# Patient Record
Sex: Female | Born: 1975 | Race: White | Hispanic: No | Marital: Married | Smoking: Never smoker
Health system: Southern US, Community
[De-identification: ages and names within clinical notes are randomized; demographics above are authoritative.]

## PROBLEM LIST (undated history)

## (undated) DIAGNOSIS — Z8759 Personal history of other complications of pregnancy, childbirth and the puerperium: Secondary | ICD-10-CM

## (undated) DIAGNOSIS — J329 Chronic sinusitis, unspecified: Secondary | ICD-10-CM

## (undated) DIAGNOSIS — Z973 Presence of spectacles and contact lenses: Secondary | ICD-10-CM

## (undated) DIAGNOSIS — J302 Other seasonal allergic rhinitis: Secondary | ICD-10-CM

## (undated) DIAGNOSIS — R102 Pelvic and perineal pain unspecified side: Secondary | ICD-10-CM

## (undated) DIAGNOSIS — L409 Psoriasis, unspecified: Secondary | ICD-10-CM

## (undated) HISTORY — DX: Psoriasis, unspecified: L40.9

## (undated) HISTORY — PX: DILATION AND EVACUATION: SHX1459

---

## 1999-10-01 HISTORY — PX: IM NAILING FEMORAL SHAFT FRACTURE: SUR731

## 2004-08-22 ENCOUNTER — Ambulatory Visit: Payer: Self-pay | Admitting: Family Medicine

## 2004-11-23 ENCOUNTER — Ambulatory Visit: Payer: Self-pay | Admitting: Family Medicine

## 2005-05-15 ENCOUNTER — Ambulatory Visit: Payer: Self-pay | Admitting: Family Medicine

## 2005-06-27 ENCOUNTER — Ambulatory Visit: Payer: Self-pay | Admitting: Family Medicine

## 2005-07-01 ENCOUNTER — Encounter: Admission: RE | Admit: 2005-07-01 | Discharge: 2005-07-01 | Payer: Self-pay | Admitting: Family Medicine

## 2005-07-04 ENCOUNTER — Ambulatory Visit: Payer: Self-pay | Admitting: Family Medicine

## 2005-07-17 ENCOUNTER — Ambulatory Visit: Payer: Self-pay | Admitting: Internal Medicine

## 2005-10-01 ENCOUNTER — Other Ambulatory Visit: Admission: RE | Admit: 2005-10-01 | Discharge: 2005-10-01 | Payer: Self-pay | Admitting: Obstetrics and Gynecology

## 2005-10-24 ENCOUNTER — Ambulatory Visit (HOSPITAL_COMMUNITY): Admission: RE | Admit: 2005-10-24 | Discharge: 2005-10-24 | Payer: Self-pay | Admitting: Obstetrics and Gynecology

## 2005-11-26 ENCOUNTER — Ambulatory Visit (HOSPITAL_COMMUNITY): Admission: RE | Admit: 2005-11-26 | Discharge: 2005-11-26 | Payer: Self-pay | Admitting: Obstetrics and Gynecology

## 2005-12-16 ENCOUNTER — Ambulatory Visit (HOSPITAL_COMMUNITY): Admission: RE | Admit: 2005-12-16 | Discharge: 2005-12-16 | Payer: Self-pay | Admitting: Obstetrics and Gynecology

## 2005-12-17 ENCOUNTER — Encounter (INDEPENDENT_AMBULATORY_CARE_PROVIDER_SITE_OTHER): Payer: Self-pay | Admitting: *Deleted

## 2006-06-04 ENCOUNTER — Inpatient Hospital Stay (HOSPITAL_COMMUNITY): Admission: AD | Admit: 2006-06-04 | Discharge: 2006-06-04 | Payer: Self-pay | Admitting: Obstetrics and Gynecology

## 2006-08-15 ENCOUNTER — Ambulatory Visit: Payer: Self-pay | Admitting: Family Medicine

## 2006-09-30 DIAGNOSIS — Z8759 Personal history of other complications of pregnancy, childbirth and the puerperium: Secondary | ICD-10-CM

## 2006-09-30 HISTORY — DX: Personal history of other complications of pregnancy, childbirth and the puerperium: Z87.59

## 2007-03-11 ENCOUNTER — Inpatient Hospital Stay (HOSPITAL_COMMUNITY): Admission: AD | Admit: 2007-03-11 | Discharge: 2007-03-11 | Payer: Self-pay | Admitting: Obstetrics and Gynecology

## 2007-04-21 ENCOUNTER — Encounter: Admission: RE | Admit: 2007-04-21 | Discharge: 2007-04-21 | Payer: Self-pay | Admitting: Obstetrics and Gynecology

## 2007-06-02 ENCOUNTER — Inpatient Hospital Stay (HOSPITAL_COMMUNITY): Admission: AD | Admit: 2007-06-02 | Discharge: 2007-06-05 | Payer: Self-pay | Admitting: Obstetrics and Gynecology

## 2007-06-02 ENCOUNTER — Encounter (INDEPENDENT_AMBULATORY_CARE_PROVIDER_SITE_OTHER): Payer: Self-pay | Admitting: Obstetrics and Gynecology

## 2007-11-11 ENCOUNTER — Ambulatory Visit: Payer: Self-pay | Admitting: Family Medicine

## 2007-11-11 DIAGNOSIS — L408 Other psoriasis: Secondary | ICD-10-CM

## 2007-11-11 DIAGNOSIS — R519 Headache, unspecified: Secondary | ICD-10-CM | POA: Insufficient documentation

## 2007-11-11 DIAGNOSIS — K219 Gastro-esophageal reflux disease without esophagitis: Secondary | ICD-10-CM

## 2007-11-11 DIAGNOSIS — R51 Headache: Secondary | ICD-10-CM

## 2007-11-13 LAB — CONVERTED CEMR LAB
ALT: 18 units/L (ref 0–35)
AST: 17 units/L (ref 0–37)
Albumin: 4.1 g/dL (ref 3.5–5.2)
Alkaline Phosphatase: 138 units/L — ABNORMAL HIGH (ref 39–117)
BUN: 12 mg/dL (ref 6–23)
Basophils Absolute: 0.1 10*3/uL (ref 0.0–0.1)
Basophils Relative: 1 % (ref 0.0–1.0)
Bilirubin, Direct: 0.2 mg/dL (ref 0.0–0.3)
CO2: 28 meq/L (ref 19–32)
Calcium: 9.7 mg/dL (ref 8.4–10.5)
Chloride: 105 meq/L (ref 96–112)
Cholesterol: 158 mg/dL (ref 0–200)
Creatinine, Ser: 0.8 mg/dL (ref 0.4–1.2)
Eosinophils Absolute: 0.2 10*3/uL (ref 0.0–0.6)
Eosinophils Relative: 3 % (ref 0.0–5.0)
GFR calc Af Amer: 108 mL/min
GFR calc non Af Amer: 90 mL/min
Glucose, Bld: 88 mg/dL (ref 70–99)
HCT: 45.3 % (ref 36.0–46.0)
HDL: 40.5 mg/dL (ref >39.0)
Hemoglobin: 15.3 g/dL — ABNORMAL HIGH (ref 12.0–15.0)
LDL Cholesterol: 102 mg/dL — ABNORMAL HIGH (ref 0–99)
Lymphocytes Relative: 27.2 % (ref 12.0–46.0)
MCHC: 33.7 g/dL (ref 30.0–36.0)
MCV: 88.3 fL (ref 78.0–100.0)
Monocytes Absolute: 0.7 10*3/uL (ref 0.2–0.7)
Monocytes Relative: 9.5 % (ref 3.0–11.0)
Neutro Abs: 4.5 10*3/uL (ref 1.4–7.7)
Neutrophils Relative %: 59.3 % (ref 43.0–77.0)
Platelets: 306 10*3/uL (ref 150–400)
Potassium: 4.6 meq/L (ref 3.5–5.1)
RBC: 5.12 M/uL — ABNORMAL HIGH (ref 3.87–5.11)
RDW: 12.1 % (ref 11.5–14.6)
Sodium: 141 meq/L (ref 135–145)
TSH: 2.19 microintl units/mL (ref 0.35–5.50)
Total Bilirubin: 0.7 mg/dL (ref 0.3–1.2)
Total CHOL/HDL Ratio: 3.9
Total Protein: 7.2 g/dL (ref 6.0–8.3)
Triglycerides: 79 mg/dL (ref 0–149)
VLDL: 16 mg/dL (ref 0–40)
WBC: 7.5 10*3/uL (ref 4.5–10.5)

## 2008-11-14 ENCOUNTER — Ambulatory Visit: Payer: Self-pay | Admitting: Family Medicine

## 2008-11-14 DIAGNOSIS — J209 Acute bronchitis, unspecified: Secondary | ICD-10-CM

## 2008-11-14 DIAGNOSIS — B009 Herpesviral infection, unspecified: Secondary | ICD-10-CM | POA: Insufficient documentation

## 2008-11-15 ENCOUNTER — Telehealth: Payer: Self-pay | Admitting: Family Medicine

## 2008-11-22 ENCOUNTER — Telehealth: Payer: Self-pay | Admitting: Family Medicine

## 2009-10-09 ENCOUNTER — Ambulatory Visit (HOSPITAL_COMMUNITY): Admission: RE | Admit: 2009-10-09 | Discharge: 2009-10-09 | Payer: Self-pay | Admitting: Obstetrics and Gynecology

## 2010-01-16 ENCOUNTER — Ambulatory Visit: Payer: Self-pay | Admitting: Family Medicine

## 2010-01-16 LAB — CONVERTED CEMR LAB
Bilirubin Urine: NEGATIVE
Blood in Urine, dipstick: NEGATIVE
Glucose, Urine, Semiquant: NEGATIVE
Ketones, urine, test strip: NEGATIVE
Nitrite: NEGATIVE
Protein, U semiquant: NEGATIVE
Specific Gravity, Urine: 1.015
Urobilinogen, UA: 0.2
WBC Urine, dipstick: NEGATIVE
pH: 7

## 2010-01-22 ENCOUNTER — Ambulatory Visit: Payer: Self-pay | Admitting: Family Medicine

## 2010-01-23 LAB — CONVERTED CEMR LAB
ALT: 19 units/L (ref 0–35)
AST: 21 units/L (ref 0–37)
Albumin: 3.9 g/dL (ref 3.5–5.2)
Alkaline Phosphatase: 67 units/L (ref 39–117)
BUN: 9 mg/dL (ref 6–23)
Basophils Absolute: 0 10*3/uL (ref 0.0–0.1)
Basophils Relative: 0.5 % (ref 0.0–3.0)
Bilirubin, Direct: 0.1 mg/dL (ref 0.0–0.3)
CO2: 30 meq/L (ref 19–32)
Calcium: 9.3 mg/dL (ref 8.4–10.5)
Chloride: 107 meq/L (ref 96–112)
Cholesterol: 151 mg/dL (ref 0–200)
Creatinine, Ser: 0.8 mg/dL (ref 0.4–1.2)
Eosinophils Absolute: 0.2 10*3/uL (ref 0.0–0.7)
Eosinophils Relative: 2.2 % (ref 0.0–5.0)
GFR calc non Af Amer: 87.73 mL/min (ref >60)
Glucose, Bld: 94 mg/dL (ref 70–99)
HCT: 40.4 % (ref 36.0–46.0)
HDL: 46.6 mg/dL (ref >39.00)
Hemoglobin: 13.9 g/dL (ref 12.0–15.0)
LDL Cholesterol: 87 mg/dL (ref 0–99)
Lymphocytes Relative: 32.2 % (ref 12.0–46.0)
Lymphs Abs: 2.2 10*3/uL (ref 0.7–4.0)
MCHC: 34.4 g/dL (ref 30.0–36.0)
MCV: 91.6 fL (ref 78.0–100.0)
Monocytes Absolute: 0.6 10*3/uL (ref 0.1–1.0)
Monocytes Relative: 8.6 % (ref 3.0–12.0)
Neutro Abs: 3.9 10*3/uL (ref 1.4–7.7)
Neutrophils Relative %: 56.5 % (ref 43.0–77.0)
Platelets: 264 10*3/uL (ref 150.0–400.0)
Potassium: 4.3 meq/L (ref 3.5–5.1)
RBC: 4.41 M/uL (ref 3.87–5.11)
RDW: 12.9 % (ref 11.5–14.6)
Sodium: 148 meq/L — ABNORMAL HIGH (ref 135–145)
TSH: 1.77 microintl units/mL (ref 0.35–5.50)
Total Bilirubin: 0.4 mg/dL (ref 0.3–1.2)
Total CHOL/HDL Ratio: 3
Total Protein: 7.4 g/dL (ref 6.0–8.3)
Triglycerides: 85 mg/dL (ref 0.0–149.0)
VLDL: 17 mg/dL (ref 0.0–40.0)
WBC: 6.9 10*3/uL (ref 4.5–10.5)

## 2010-10-30 NOTE — Assessment & Plan Note (Signed)
Summary: cpx no pap//ccm   Vital Signs:  Patient profile:   35 year old female Weight:      222 pounds BMI:     36.51 Pulse rate:   80 / minute Pulse rhythm:   regular BP sitting:   116 / 80  (left arm) Cuff size:   large  Vitals Entered By: Raechel Ache, RN (January 22, 2010 9:01 AM) CC: CPX, labs done. Sees gyn.   History of Present Illness: 35 yr old female for a cpx. She feels great and has no complaints. She recently started taking Zumba classes for exercise.   Allergies (verified): No Known Drug Allergies  Past History:  Past Medical History: Psoriasis, sees Dr. Campbell Stall GERD Headache constipation, has seen Dr. Marina Goodell SVD x 1 fever blisters sees Dr. Arelia Sneddon for GYN exams  Past Surgical History: pin placed left femur for fracture  Family History: Reviewed history from 11/11/2007 and no changes required. Crohn's disease IBS Family History of Arthritis Family History Diabetes 1st degree relative Family History High cholesterol Family History of Stroke M 1st degree relative <50 Family History Thyroid disease  Social History: Reviewed history from 11/11/2007 and no changes required. Married Never Smoked Alcohol use-yes Drug use-no Regular exercise-no  Review of Systems  The patient denies anorexia, fever, weight loss, weight gain, vision loss, decreased hearing, hoarseness, chest pain, syncope, dyspnea on exertion, peripheral edema, prolonged cough, headaches, hemoptysis, abdominal pain, melena, hematochezia, severe indigestion/heartburn, hematuria, incontinence, genital sores, muscle weakness, suspicious skin lesions, transient blindness, difficulty walking, depression, unusual weight change, abnormal bleeding, enlarged lymph nodes, angioedema, breast masses, and testicular masses.    Physical Exam  General:  overweight-appearing.   Head:  Normocephalic and atraumatic without obvious abnormalities. No apparent alopecia or balding. Eyes:  No corneal or  conjunctival inflammation noted. EOMI. Perrla. Funduscopic exam benign, without hemorrhages, exudates or papilledema. Vision grossly normal. Ears:  External ear exam shows no significant lesions or deformities.  Otoscopic examination reveals clear canals, tympanic membranes are intact bilaterally without bulging, retraction, inflammation or discharge. Hearing is grossly normal bilaterally. Nose:  External nasal examination shows no deformity or inflammation. Nasal mucosa are pink and moist without lesions or exudates. Mouth:  Oral mucosa and oropharynx without lesions or exudates.  Teeth in good repair. Neck:  No deformities, masses, or tenderness noted. Chest Wall:  No deformities, masses, or tenderness noted. Lungs:  Normal respiratory effort, chest expands symmetrically. Lungs are clear to auscultation, no crackles or wheezes. Heart:  Normal rate and regular rhythm. S1 and S2 normal without gallop, murmur, click, rub or other extra sounds. Abdomen:  Bowel sounds positive,abdomen soft and non-tender without masses, organomegaly or hernias noted. Msk:  No deformity or scoliosis noted of thoracic or lumbar spine.   Pulses:  R and L carotid,radial,femoral,dorsalis pedis and posterior tibial pulses are full and equal bilaterally Extremities:  No clubbing, cyanosis, edema, or deformity noted with normal full range of motion of all joints.   Neurologic:  No cranial nerve deficits noted. Station and gait are normal. Plantar reflexes are down-going bilaterally. DTRs are symmetrical throughout. Sensory, motor and coordinative functions appear intact. Skin:  clear except for widespread psoriatic patches  Cervical Nodes:  No lymphadenopathy noted Axillary Nodes:  No palpable lymphadenopathy Inguinal Nodes:  No significant adenopathy Psych:  Cognition and judgment appear intact. Alert and cooperative with normal attention span and concentration. No apparent delusions, illusions, hallucinations   Impression  & Recommendations:  Problem # 1:  WELL ADULT  EXAM (ICD-V70.0)  Complete Medication List: 1)  Taclonex 0.005-0.064 % Oint (Calcipotriene-betameth diprop) .... Once daily 2)  Olux 0.05 % Foam (Clobetasol propionate) .... Apply to scalp once daily  Patient Instructions: 1)  Please schedule a follow-up appointment in 1 year.  2)  It is important that you exercise reguarly at least 20 minutes 5 times a week. If you develop chest pain, have severe difficulty breathing, or feel very tired, stop exercising immediately and seek medical attention.  3)  You need to lose weight. Consider a lower calorie diet and regular exercise.

## 2010-12-20 ENCOUNTER — Encounter: Payer: Self-pay | Admitting: Family Medicine

## 2010-12-20 ENCOUNTER — Ambulatory Visit (INDEPENDENT_AMBULATORY_CARE_PROVIDER_SITE_OTHER): Payer: BC Managed Care – PPO | Admitting: Family Medicine

## 2010-12-20 VITALS — BP 130/80 | HR 71 | Temp 98.3°F | Wt 215.0 lb

## 2010-12-20 DIAGNOSIS — R5381 Other malaise: Secondary | ICD-10-CM

## 2010-12-20 DIAGNOSIS — L408 Other psoriasis: Secondary | ICD-10-CM

## 2010-12-20 DIAGNOSIS — R05 Cough: Secondary | ICD-10-CM

## 2010-12-20 DIAGNOSIS — L409 Psoriasis, unspecified: Secondary | ICD-10-CM

## 2010-12-20 DIAGNOSIS — R531 Weakness: Secondary | ICD-10-CM

## 2010-12-20 LAB — CBC WITH DIFFERENTIAL/PLATELET
Basophils Absolute: 0 10*3/uL (ref 0.0–0.1)
Basophils Relative: 0.5 % (ref 0.0–3.0)
Eosinophils Absolute: 0.2 10*3/uL (ref 0.0–0.7)
Eosinophils Relative: 2.4 % (ref 0.0–5.0)
HCT: 42.7 % (ref 36.0–46.0)
Hemoglobin: 15.1 g/dL — ABNORMAL HIGH (ref 12.0–15.0)
Lymphocytes Relative: 36.4 % (ref 12.0–46.0)
Lymphs Abs: 3.2 10*3/uL (ref 0.7–4.0)
MCHC: 35.3 g/dL (ref 30.0–36.0)
MCV: 90.5 fl (ref 78.0–100.0)
Monocytes Absolute: 0.9 10*3/uL (ref 0.1–1.0)
Neutro Abs: 4.4 10*3/uL (ref 1.4–7.7)
Platelets: 317 10*3/uL (ref 150.0–400.0)
RBC: 4.72 Mil/uL (ref 3.87–5.11)
RDW: 12.3 % (ref 11.5–14.6)
WBC: 8.8 10*3/uL (ref 4.5–10.5)

## 2010-12-20 NOTE — Progress Notes (Signed)
  Subjective:    Patient ID: [name], female    DOB: Dec 30, 1975, 35 y.o.   MRN: [id number]  HPI Here for 10 days of generalized fatigue, a mild ST, and a dry tickling cough. No fever or sinus symptoms. She actually missed a day of work and stayed in bed all day due to fatigue. She feels slightly better today. Of note, she was started on Humira last August by Dr. Emily Filbert for psoriasis. She has had periodic blood screening, the most recent of which was 2 months ago.    Review of Systems  Constitutional: Positive for fatigue. Negative for fever, appetite change and unexpected weight change.  HENT: Positive for sore throat. Negative for nosebleeds, congestion, sneezing, postnasal drip and sinus pressure.   Eyes: Negative.   Respiratory: Positive for cough. Negative for apnea, choking, chest tightness, shortness of breath, wheezing and stridor.        Objective:   Physical Exam  Constitutional: She appears well-developed and well-nourished.  HENT:  Head: Normocephalic and atraumatic.  Right Ear: External ear normal.  Left Ear: External ear normal.  Nose: Nose normal.  Mouth/Throat: Oropharynx is clear and moist.  Eyes: Conjunctivae and EOM are normal. Pupils are equal, round, and reactive to light.  Neck: Normal range of motion. Neck supple. No thyromegaly present.  Cardiovascular: Normal rate, regular rhythm, normal heart sounds and intact distal pulses.   Pulmonary/Chest: Effort normal and breath sounds normal. No respiratory distress. She has no wheezes. She has no rales. She exhibits no tenderness.  Lymphadenopathy:    She has no cervical adenopathy.          Assessment & Plan:  This seems to be a simple flare of her respiratory allergies on top of Humira use. Get plenty of rest, drink fluids, try Zyrtec prn .

## 2010-12-24 ENCOUNTER — Telehealth: Payer: Self-pay

## 2010-12-24 NOTE — Telephone Encounter (Signed)
Pt. Aware of lab results.

## 2010-12-24 NOTE — Telephone Encounter (Signed)
Message copied by Madison Hickman on Mon Dec 24, 2010  9:58 AM ------      Message from: Dwaine Deter      Created: Fri Dec 21, 2010  5:18 PM       She shows evidence of having had mononucleosis in the past but not right now. Her CBC is also normal. Follow up prn

## 2011-02-12 NOTE — Op Note (Signed)
[name], [name]             ACCOUNT NO.:  [id number]   MEDICAL RECORD NO.:  [id number]          PATIENT TYPE:  INP   LOCATION:  9374                          FACILITY:  [facility]   PHYSICIAN:  [name], M.D.   DATE OF BIRTH:  22-Jun-1976   DATE OF PROCEDURE:  06/02/2007  DATE OF DISCHARGE:                               OPERATIVE REPORT   PREOPERATIVE DIAGNOSIS:  Intrauterine pregnancy at 39+ weeks with  pregnancy induced hypertension and possible abruption of placenta.   POSTOPERATIVE DIAGNOSIS:  Intrauterine pregnancy at 39+ weeks with  pregnancy induced hypertension and possible abruption of placenta.   PROCEDURE:  Low transverse cesarean section.   SURGEON:  [name], M.D.   ANESTHESIA:  Epidural.   ESTIMATED BLOOD LOSS:  500-600 mL.   PACKS AND DRAINS:  None.   BLOOD REPLACED:  None.   COMPLICATIONS:  None.   INDICATIONS:  The patient is a 35 year old gravid 3, para 0 female at  39+ weeks.  She presented to triage to rule out of labor. Blood pressure  was noted to be elevated. PIH labs were normal. Due to elevated blood  pressure, she is brought in for induction of labor.  We began Pitocin.  She did progress to approximately 5 cm of dilatation.  We did perform  artificial rupture of membranes with fluid clear.  She began having the  sudden onset of bright red bleeding with clots, fetal heart rate started  demonstrating repetitive variable decelerations although beat to beat  variability was good.  It was difficult to assess the uterus due to  obesity.  She was having no pain but did have an epidural in place.  In  view of the excessive bleeding and the fetal heart rate changes, concern  about abruption was brought up.  We decided proceed with primary  cesarean section.  The risks were discussed including the of infection,  the risk of hemorrhage that could require transfusion with risk of AIDS  or hepatitis, the risk of injury to adjacent organs including  bladder,  bowel, ureters that could require further exploratory surgery, the risk  of deep venous thrombosis and pulmonary embolus.   DESCRIPTION OF PROCEDURE:  The patient was taken to the OR and placed in  the supine position with left lateral tilt.  After a satisfactory level  of epidural anesthesia had been obtained, the abdomen was prepped out  with Betadine and draped in a sterile field.  A low transverse skin  incision was made with the knife and carried through the subcutaneous  tissue.  The fascia was entered and incision fashioned laterally.  The  fascia was taken off the muscle superiorly and inferiorly.  The rectus  muscles were separated in the midline.  The peritoneum was entered  sharply.  The incision of the peritoneum was extended both superiorly  and inferiorly.  A low transverse bladder flap was developed. A low  transverse uterine incision was begun with the knife and extended  laterally using manual traction.  The infant was delivered with  elevation of the head and fundal pressure.  The  infant was a viable  female who weighed 7 pounds 11 ounces, Apgars were 9/9, umbilical artery  pH was 7.36.  The placenta was delivered manually.  There was no  evidence of an active abruption.  The placenta was sent to pathology.  The uterus was exteriorized for closure.  Tubes and ovaries were  unremarkable.  The uterus was closed with a running locking suture of 0  chromic using a two layer closure technique.  The uterus was returned to  the abdominal cavity.  We had clear urine output.  We irrigated the  pelvis.  We had good hemostasis.  The muscles and peritoneum were closed  with a running suture of 3-0 Vicryl, the fascia was closed with a  running suture of 0 PDS, the subcu was closed with a running suture of 2-  0 plain, and the skin was closed with staples and Steri-Strips.  Sponge,  instrument, and needle count was reported as correct by the circulating  nurse x2.  The  Foley catheter remained clear at the time of closure.  The patient tolerated the procedure well and was returned to the  recovery room in good condition.      [name], M.D.  Electronically Signed     JSM/MEDQ  D:  06/02/2007  T:  06/02/2007  Job:  [id number]

## 2011-02-12 NOTE — Assessment & Plan Note (Signed)
[facility]                                 ON-CALL NOTE   NAME:[name], [name]                      MRN:          [id number]  DATE:11/12/2008                            DOB:          09-06-1976    Doctor is Dr. Clent Ridges.   SUBJECTIVE:  Several days of cough, congestion, not a severe cough,  occasionally productive.  No fever.  No shortness of breath.  No  wheezing.  She does say that today the new symptom is that she has some  slight pressure in her chest that feels like a tightness.   ASSESSMENT AND PLAN:  Viral upper respiratory tract infection.  I  recommended Mucinex-D along with Tylenol or ibuprofen for symptoms.  If  she develops any wheezing or shortness of breath, she should be seen at  an urgent care.  Otherwise, if her symptoms continue she can be seen by  her regular doctor on Monday.     Kerby Nora, MD  Electronically Signed    AB/MedQ  DD: 11/12/2008  DT: 11/12/2008  Job #: [id number]

## 2011-02-15 NOTE — Op Note (Signed)
NAMEBRITTANIA, [name]             ACCOUNT NO.:  [id number]   MEDICAL RECORD NO.:  [id number]          PATIENT TYPE:  AMB   LOCATION:  SDC                           FACILITY:  [facility]   PHYSICIAN:  Juluis Mire, M.D.   DATE OF BIRTH:  08/30/1976   DATE OF PROCEDURE:  12/16/2005  DATE OF DISCHARGE:                                 OPERATIVE REPORT   PREOPERATIVE DIAGNOSIS:  Nonviable first trimester pregnancy.   POSTOPERATIVE DIAGNOSIS:  Nonviable first trimester pregnancy.   OPERATIVE PROCEDURE:  Dilatation evacuation.   SURGEON:  Dr. Arelia Sneddon   ANESTHESIA:  Paracervical block along with sedation.   ESTIMATED BLOOD LOSS:  100 mL.   PACKS AND DRAINS:  None.   INTRAOPERATIVE BLOOD REPLACEMENT:  None.   COMPLICATIONS:  None.   INDICATIONS:  Dictated in history and physical.   PROCEDURE AS FOLLOWS:  The patient taken to the OR and placed in supine  position.  After sedation, was placed in dorsal lithotomy position using the  Allen stirrups.  The patient was then draped in a sterile field.  A speculum  was placed in the vaginal vault.  The cervix and vagina were cleansed out  with Betadine.  A paracervical block was instituted using 1% Nesacaine.  The  cervix was secured to it with a single-tooth tenaculum, uterus sounded to  approximately 10-11 cm.  The cervix was serially dilated to a size 27 Pratt  dilator.  A size 8 curved suction curette was then introduced.  Intrauterine  contents were removed using suction curetting.  This was continued until no  additional tissue was obtained.  Sharp curetting revealed all of quadrants  to be clear.  Repeat suction curetting revealed no additional tissue.  Speculum and single-tooth tenaculum then removed.  The patient taken out of  the dorsal lithotomy position and once alert, transferred to recovery room  in good condition.  Sponge, instrument, and needle count reported as correct  by circulating nurse.      Juluis Mire, M.D.  Electronically Signed     JSM/MEDQ  D:  12/16/2005  T:  12/17/2005  Job:  [id number]

## 2011-02-15 NOTE — Discharge Summary (Signed)
NAMEKAWTHAR, ENNEN             ACCOUNT NO.:  [id number]   MEDICAL RECORD NO.:  [id number]          PATIENT TYPE:  INP   LOCATION:  9145                          [facility]ACILITY:  [facility]   PHYSICIAN:  Dineen Kid. Rana Snare, M.D.    DATE OF BIRTH:  1976/05/24   DATE OF ADMISSION:  06/02/2007  DATE OF DISCHARGE:  06/05/2007                               DISCHARGE SUMMARY   ADMITTING DIAGNOSES:  1. Intrauterine pregnancy at 39+ weeks estimated gestational age.  2. Induction of labor secondary to pregnancy-induced hypertension.   DISCHARGE DIAGNOSES:  1. Status post low transverse cesarean section secondary to      questionable placental abruption.  2. Viable female infant.   PROCEDURE:  Primary low transverse cesarean section.   REASON FOR ADMISSION:  Please see written H&P.   HOSPITAL COURSE:  The patient is a 35 year old gravida 3, para 0 that  was admitted to [facility] for an induction of labor at  51 weeks estimated gestational age.  The patient's blood pressure was  noted to be elevated.  PIH labs were normal.  Due to elevation in blood  pressure, the patient was brought in for an induction of labor.  The  patient was started on Pitocin.  She did progress to approximately 5 cm  in dilatation.  The patient did undergo artificial rupture of membranes  with fluid noted to be clear.  The patient suddenly began to have sudden  onset of bright red bleeding with clots.  Fetal heart tones demonstrated  some repetitive variable deceleration although beat-to-beat variability  continued to be good.  The patient did have an epidural in place.  She  was not experiencing any pain but in view of the excessive bleeding and  fetal heart tones changes, decision was made to proceed with a primary  low transverse cesarean section due to concern for possible placental  abruption.  The patient was then transferred to the operating room where  epidural was dosed to an adequate surgical level.  A  low transverse  incision was made with the delivery of a viable female infant weighing 7  pounds 11 ounces with Apgars of 9 at one minute, 9 at five minutes.  Arterial cord pH was 7.36.  The patient tolerated the procedure well and  was taken to the recovery room in stable condition.  On postoperative  day #1, the patient was without complaint.  Vital signs were stable.  Blood pressures were 130s to 140s over 80s.  Abdomen soft, fundus firm  and nontender.  Abdominal dressing was noted to be clean, dry and  intact.  The patient was noted to have adequate urine output.  Laboratory findings were within normal limits with the exception of a  uric acid which was 7.6.  On postoperative day #2, the patient  complained of some gas pain.  Otherwise, vital signs were stable, she  was afebrile.  Blood pressure was normal.  Fundus firm and nontender.  Abdominal dressing was noted to be clean, dry and intact.  On  postoperative day #3, the patient denied headache, blurred vision or  right upper quadrant pain.  Vital signs were stable.  She was afebrile.  Blood pressure was 130/91 to 124/80.  Abdomen was soft, fundus firm and  nontender.  Incision was clean, dry and intact.  Discharge instructions  were reviewed and the patient was later discharged home.   CONDITION ON DISCHARGE:  Good.   DIET:  Regular as tolerated.   ACTIVITY:  No heavy lifting, no driving x2 weeks, no vaginal entry.   FOLLOWUP:  The patient to follow up in the office in 1 week for an  incision check and blood pressure check.  She is to call for temperature  greater than 100 degrees, persistent nausea and vomiting, heavy vaginal  bleeding, and/or redness or drainage from the incisional site.   DISCHARGE MEDICATIONS:  1. Percocet 5/325 #30 one p.o. every 4-6 hours p.r.n.  2. Motrin 600 mg every 6 hours.  3. Prenatal vitamins one p.o. daily.  4. Colace one p.o. daily p.r.n.      Julio Sicks, N.P.      Dineen Kid Rana Snare,  M.D.  Electronically Signed    CC/MEDQ  D:  06/30/2007  T:  06/30/2007  Job:  [id number]

## 2011-02-15 NOTE — H&P (Signed)
[name], [name]             ACCOUNT NO.:  [id number]   MEDICAL RECORD NO.:  [id number]          PATIENT TYPE:  AMB   LOCATION:  SDC                           [facility]ILITY:  [facility]   PHYSICIAN:  Juluis Mire, M.D.   DATE OF BIRTH:  02/07/1976   DATE OF ADMISSION:  12/16/2005  DATE OF DISCHARGE:                                HISTORY & PHYSICAL   35 year old gravida 1, para 0 married female presents for a D&E.  Patient  has been followed with early pregnancy.  She was having spotting.  Because  of this we did quantitative levels which actually rose appropriately.  We  then followed up with serial ultrasounds.  We had extreme lag of appropriate  development of the gestational sac.  Last ultrasound of March 8 revealed a  large dilated yolk sac.  There was no fetal pole or did it have activity.  This was consistent with a nonviable first trimester pregnancy and the  patient now presents for dilatation and evacuation.  Again, blood type is A  negative.  She has received RhoGAM already; we will probably repeat the  dose.   ALLERGIES:  No known drug allergies.   MEDICATIONS:  Just vitamins.   PAST MEDICAL HISTORY:  Usual childhood disease without any significant  sequelae.  She is being actively managed for psoriasis.  She has had no  previous surgical history.   FAMILY HISTORY:  There is a history of diabetes in maternal grandmother.  Otherwise, no significant issues.   SOCIAL HISTORY:  No tobacco or alcohol use.   REVIEW OF SYSTEMS:  Noncontributory.   PHYSICAL EXAMINATION:  VITAL SIGNS:  Patient is afebrile with stable vital  signs.  HEENT:  Patient normocephalic.  Pupils are equal, round, and reactive to  light and accommodation.  Extraocular movements are intact.  Sclerae and  conjunctivae clear.  Oropharynx clear.  NECK:  Without thyromegaly.  BREASTS:  No discrete masses.  LUNGS:  Clear.  CARDIOVASCULAR:  Regular rhythm and rate without murmurs or gallops.  ABDOMEN:   Benign.  No mass, organomegaly, or tenderness.  PELVIC:  Normal external genitalia.  Vaginal mucosa clear.  Cervix  unremarkable.  Uterus approximately 8-9 weeks in size.  Adnexa unremarkable.  EXTREMITIES:  Trace edema.  NEUROLOGIC:  Grossly within normal limits.   IMPRESSION:  Nonviable first trimester pregnancy.   PLAN:  The patient will undergo dilatation and evacuation.  Risks of surgery  have been discussed including the risks of infection, risks of vascular  injury that could lead to hemorrhage requiring transfusion or possible  hysterectomy, risk of perforation leading to injury to adjacent organs  requiring exploratory surgery, risk of deep venous thrombosis and pulmonary  embolus.  Patient expressed understanding of indications and risks and  accepting of them.      Juluis Mire, M.D.  Electronically Signed     JSM/MEDQ  D:  12/16/2005  T:  12/16/2005  Job:  [id number]

## 2011-05-29 ENCOUNTER — Encounter: Payer: Self-pay | Admitting: Family Medicine

## 2011-05-29 ENCOUNTER — Ambulatory Visit (INDEPENDENT_AMBULATORY_CARE_PROVIDER_SITE_OTHER): Payer: BC Managed Care – PPO | Admitting: Family Medicine

## 2011-05-29 VITALS — BP 110/72 | HR 97 | Temp 98.2°F | Wt 220.0 lb

## 2011-05-29 DIAGNOSIS — M549 Dorsalgia, unspecified: Secondary | ICD-10-CM

## 2011-05-29 MED ORDER — CYCLOBENZAPRINE HCL 10 MG PO TABS: 10.0000 mg | ORAL_TABLET | Freq: Three times a day (TID) | ORAL | Status: AC | PRN

## 2011-05-29 MED ORDER — IBUPROFEN 800 MG PO TABS: 800.0000 mg | ORAL_TABLET | Freq: Four times a day (QID) | ORAL | Status: AC | PRN

## 2011-05-29 MED ORDER — HYDROCODONE-ACETAMINOPHEN 7.5-500 MG PO TABS: 1.0000 | ORAL_TABLET | Freq: Four times a day (QID) | ORAL | Status: AC | PRN

## 2011-05-29 NOTE — Progress Notes (Signed)
  Subjective:    Patient ID: [name], female    DOB: 26-Apr-1976, 35 y.o.   MRN: [id number]  HPI Here for 2 days of sharp severe left  sided low back pains which started after she stooped over to pick something up at work. No pain or weakness in the legs. She is using heat and Ibuprofen. Sitting and lying in bed hurts worse than standing.    Review of Systems  Constitutional: Negative.   Gastrointestinal: Negative.   Genitourinary: Negative.   Musculoskeletal: Positive for back pain.       Objective:   Physical Exam  Constitutional:       In pain, limping a bit   Abdominal: Soft. Bowel sounds are normal. She exhibits no distension and no mass. There is no tenderness. There is no rebound and no guarding.  Musculoskeletal:       Tender along the left  lower back with some spasm. ROM is very limited due to pain. SLR is positive on both sides.           Assessment & Plan:  Rest, off work today and tomorrow. Use moist heat. Use Motrin, Lortab, and Flexeril. Recheck prn

## 2011-07-03 ENCOUNTER — Other Ambulatory Visit: Payer: Self-pay

## 2011-07-03 NOTE — Telephone Encounter (Signed)
Rx request for hydrocodone-acetaminophen 7.5-500 #60. Pt last seen 05/29/11. Pls advise.

## 2011-07-04 MED ORDER — HYDROCODONE-ACETAMINOPHEN 7.5-500 MG PO TABS: 1.0000 | ORAL_TABLET | Freq: Four times a day (QID) | ORAL | Status: AC | PRN

## 2011-07-04 NOTE — Telephone Encounter (Signed)
Call in another #60 with no refills. If this is not sufficient, she will need another OV

## 2011-07-04 NOTE — Telephone Encounter (Signed)
rx has been called in to pharmacy. Pt is aware that an ov is needed for future refills.

## 2011-07-12 LAB — COMPREHENSIVE METABOLIC PANEL
ALT: 19
ALT: 21
ALT: 23
AST: 22
AST: 25
AST: 25
Albumin: 2.1 — ABNORMAL LOW
Albumin: 2.2 — ABNORMAL LOW
Albumin: 2.7 — ABNORMAL LOW
Alkaline Phosphatase: 101
Alkaline Phosphatase: 117
Alkaline Phosphatase: 155 — ABNORMAL HIGH
BUN: 6
BUN: 6
BUN: 9
CO2: 21
CO2: 22
CO2: 25
Calcium: 7.4 — ABNORMAL LOW
Calcium: 7.6 — ABNORMAL LOW
Calcium: 8.9
Chloride: 105
Chloride: 108
Chloride: 110
Creatinine, Ser: 0.58
Creatinine, Ser: 0.66
Creatinine, Ser: 0.69
GFR calc Af Amer: >60
GFR calc Af Amer: >60
GFR calc Af Amer: >60
GFR calc non Af Amer: >60
GFR calc non Af Amer: >60
GFR calc non Af Amer: >60
Glucose, Bld: 79
Glucose, Bld: 82
Glucose, Bld: 92
Potassium: 3.6
Potassium: 3.9
Potassium: 4.1
Sodium: 134 — ABNORMAL LOW
Sodium: 137
Sodium: 138
Total Bilirubin: 0.3
Total Bilirubin: 0.6
Total Bilirubin: 0.6
Total Protein: 4.8 — ABNORMAL LOW
Total Protein: 4.9 — ABNORMAL LOW
Total Protein: 5.6 — ABNORMAL LOW

## 2011-07-12 LAB — RH IMMUNE GLOB WKUP(>/=20WKS)(NOT WOMEN'S HOSP): Fetal Screen: NEGATIVE

## 2011-07-12 LAB — CBC
HCT: 31.1 — ABNORMAL LOW
HCT: 34.1 — ABNORMAL LOW
HCT: 39.8
Hemoglobin: 11.1 — ABNORMAL LOW
Hemoglobin: 12.4
Hemoglobin: 14
MCHC: 35.2
MCHC: 35.8
MCHC: 36.2 — ABNORMAL HIGH
MCV: 90
MCV: 91
MCV: 91
Platelets: 173
Platelets: 179
Platelets: 188
RBC: 3.42 — ABNORMAL LOW
RBC: 3.79 — ABNORMAL LOW
RBC: 4.37
RDW: 13.5
RDW: 14.1 — ABNORMAL HIGH
RDW: 14.3 — ABNORMAL HIGH
WBC: 12.2 — ABNORMAL HIGH
WBC: 14.3 — ABNORMAL HIGH
WBC: 9.5

## 2011-07-12 LAB — DIFFERENTIAL
Basophils Absolute: 0
Basophils Relative: 0
Eosinophils Absolute: 0.1
Eosinophils Relative: 1
Lymphocytes Relative: 13
Lymphs Abs: 1.6
Monocytes Absolute: 0.8 — ABNORMAL HIGH
Monocytes Relative: 6
Neutro Abs: 9.7 — ABNORMAL HIGH
Neutrophils Relative %: 79 — ABNORMAL HIGH

## 2011-07-12 LAB — URIC ACID
Uric Acid, Serum: 7
Uric Acid, Serum: 7.4 — ABNORMAL HIGH
Uric Acid, Serum: 7.6 — ABNORMAL HIGH

## 2011-07-12 LAB — RPR: RPR Ser Ql: NONREACTIVE

## 2011-07-12 LAB — MAGNESIUM
Magnesium: 2.6 — ABNORMAL HIGH
Magnesium: 4.4 — ABNORMAL HIGH

## 2011-07-12 LAB — RAPID HIV SCREEN (WH-MAU): Rapid HIV Screen: NONREACTIVE

## 2011-07-12 LAB — LACTATE DEHYDROGENASE
LDH: 172
LDH: 218

## 2011-07-18 LAB — GLUCOSE TOLERANCE, 1 HOUR: Glucose, 1 Hour GTT: 154 — ABNORMAL HIGH

## 2011-07-18 LAB — RH IMMUNE GLOBULIN WORKUP (NOT WOMEN'S HOSP)

## 2011-08-26 ENCOUNTER — Encounter: Payer: Self-pay | Admitting: Family Medicine

## 2011-08-26 ENCOUNTER — Ambulatory Visit (INDEPENDENT_AMBULATORY_CARE_PROVIDER_SITE_OTHER): Payer: BC Managed Care – PPO | Admitting: Family Medicine

## 2011-08-26 ENCOUNTER — Ambulatory Visit (INDEPENDENT_AMBULATORY_CARE_PROVIDER_SITE_OTHER)
Admission: RE | Admit: 2011-08-26 | Discharge: 2011-08-26 | Disposition: A | Discharge status: Home / Self Care | Payer: BC Managed Care – PPO | Source: Ambulatory Visit | Attending: Family Medicine | Admitting: Family Medicine

## 2011-08-26 VITALS — BP 120/84 | HR 108 | Temp 98.9°F | Wt 226.0 lb

## 2011-08-26 DIAGNOSIS — G8929 Other chronic pain: Secondary | ICD-10-CM

## 2011-08-26 DIAGNOSIS — M542 Cervicalgia: Secondary | ICD-10-CM

## 2011-08-26 DIAGNOSIS — M545 Low back pain, unspecified: Secondary | ICD-10-CM

## 2011-08-26 DIAGNOSIS — M546 Pain in thoracic spine: Secondary | ICD-10-CM

## 2011-08-26 MED ORDER — MELOXICAM 15 MG PO TABS: 15.0000 mg | ORAL_TABLET | Freq: Every day | ORAL | Status: DC

## 2011-08-26 NOTE — Progress Notes (Signed)
  Subjective:    Patient ID: [name], female    DOB: 08-Nov-1975, 35 y.o.   MRN: [id number]  HPI Here for chronic stiffness and pain throughout the entire spine. She takes Motrin or Flexeril or Lortab occasionally, but nothing on a regular basis. Her back gets stiff and painful if she sits for long periods, stoops or bends, or if she is on her feet a lot. She does use Humira for her psoriasis.    Review of Systems  Constitutional: Negative.   Musculoskeletal: Positive for back pain and arthralgias.       Objective:   Physical Exam  Constitutional: She appears well-developed and well-nourished.  Musculoskeletal:       The neck and lower back are a bit stiff with reduced ROM. No tenderness          Assessment & Plan:  This could be from degenerative arthritis or psoriatic arthropathy. Get Xrays of the spine today. Try Meloxicam to reduce inflammation.

## 2011-08-29 NOTE — Progress Notes (Signed)
Quick Note:  Spoke with pt ______ 

## 2011-11-11 ENCOUNTER — Ambulatory Visit: Payer: BC Managed Care – PPO | Admitting: Family Medicine

## 2011-11-11 ENCOUNTER — Other Ambulatory Visit (INDEPENDENT_AMBULATORY_CARE_PROVIDER_SITE_OTHER): Payer: BC Managed Care – PPO

## 2011-11-11 DIAGNOSIS — Z0000 Encounter for general adult medical examination without abnormal findings: Secondary | ICD-10-CM

## 2011-11-11 LAB — CBC WITH DIFFERENTIAL/PLATELET
Basophils Relative: 0.4 % (ref 0.0–3.0)
Eosinophils Absolute: 0.2 10*3/uL (ref 0.0–0.7)
Eosinophils Relative: 2.3 % (ref 0.0–5.0)
Hemoglobin: 14.6 g/dL (ref 12.0–15.0)
Lymphocytes Relative: 30.5 % (ref 12.0–46.0)
MCHC: 34.8 g/dL (ref 30.0–36.0)
MCV: 90.7 fl (ref 78.0–100.0)
Neutro Abs: 4.6 10*3/uL (ref 1.4–7.7)
Neutrophils Relative %: 56.4 % (ref 43.0–77.0)
RBC: 4.61 Mil/uL (ref 3.87–5.11)
WBC: 8.1 10*3/uL (ref 4.5–10.5)

## 2011-11-11 LAB — LIPID PANEL
HDL: 42.6 mg/dL (ref >39.00)
Total CHOL/HDL Ratio: 4

## 2011-11-11 LAB — BASIC METABOLIC PANEL
CO2: 26 mEq/L (ref 19–32)
Calcium: 9.1 mg/dL (ref 8.4–10.5)
Chloride: 107 mEq/L (ref 96–112)
Sodium: 138 mEq/L (ref 135–145)

## 2011-11-11 LAB — POCT URINALYSIS DIPSTICK
Bilirubin, UA: NEGATIVE
Glucose, UA: NEGATIVE
Ketones, UA: NEGATIVE
Nitrite, UA: NEGATIVE
pH, UA: 5.5

## 2011-11-11 LAB — TSH: TSH: 1.7 u[IU]/mL (ref 0.35–5.50)

## 2011-11-11 LAB — HEPATIC FUNCTION PANEL
ALT: 15 U/L (ref 0–35)
Albumin: 4.1 g/dL (ref 3.5–5.2)
Alkaline Phosphatase: 76 U/L (ref 39–117)
Bilirubin, Direct: 0 mg/dL (ref 0.0–0.3)
Total Protein: 7.7 g/dL (ref 6.0–8.3)

## 2011-11-13 NOTE — Progress Notes (Signed)
Quick Note:  Spoke with pt ______ 

## 2011-11-15 ENCOUNTER — Ambulatory Visit (INDEPENDENT_AMBULATORY_CARE_PROVIDER_SITE_OTHER): Payer: BC Managed Care – PPO | Admitting: Family Medicine

## 2011-11-15 DIAGNOSIS — Z209 Contact with and (suspected) exposure to unspecified communicable disease: Secondary | ICD-10-CM

## 2011-11-15 DIAGNOSIS — Z0000 Encounter for general adult medical examination without abnormal findings: Secondary | ICD-10-CM

## 2011-11-18 ENCOUNTER — Encounter: Payer: Self-pay | Admitting: Family Medicine

## 2011-11-18 ENCOUNTER — Ambulatory Visit (INDEPENDENT_AMBULATORY_CARE_PROVIDER_SITE_OTHER): Payer: BC Managed Care – PPO | Admitting: Family Medicine

## 2011-11-18 VITALS — BP 112/70 | HR 94 | Temp 98.6°F | Ht 65.75 in | Wt 224.0 lb

## 2011-11-18 DIAGNOSIS — Z0000 Encounter for general adult medical examination without abnormal findings: Secondary | ICD-10-CM

## 2011-11-18 LAB — TB SKIN TEST
Induration: 0
TB Skin Test: NEGATIVE mm

## 2011-11-18 NOTE — Progress Notes (Signed)
  Subjective:    Patient ID: [name], female    DOB: 01-24-1976, 36 y.o.   MRN: [id number]  HPI 36 yr old female for a cpx. She feels well and has no concerns. Her back pain is much better. She has had great results from Humira shots, which she self administers every 2 weeks.    Review of Systems  Constitutional: Negative.   HENT: Negative.   Eyes: Negative.   Respiratory: Negative.   Cardiovascular: Negative.   Gastrointestinal: Negative.   Genitourinary: Negative for dysuria, urgency, frequency, hematuria, flank pain, decreased urine volume, enuresis, difficulty urinating, pelvic pain and dyspareunia.  Musculoskeletal: Negative.   Skin: Negative.   Neurological: Negative.   Hematological: Negative.   Psychiatric/Behavioral: Negative.        Objective:   Physical Exam  Constitutional: She is oriented to person, place, and time. She appears well-developed and well-nourished. No distress.  HENT:  Head: Normocephalic and atraumatic.  Right Ear: External ear normal.  Left Ear: External ear normal.  Nose: Nose normal.  Mouth/Throat: Oropharynx is clear and moist. No oropharyngeal exudate.  Eyes: Conjunctivae and EOM are normal. Pupils are equal, round, and reactive to light. No scleral icterus.  Neck: Normal range of motion. Neck supple. No JVD present. No thyromegaly present.  Cardiovascular: Normal rate, regular rhythm, normal heart sounds and intact distal pulses.  Exam reveals no gallop and no friction rub.   No murmur heard. Pulmonary/Chest: Effort normal and breath sounds normal. No respiratory distress. She has no wheezes. She has no rales. She exhibits no tenderness.  Abdominal: Soft. Bowel sounds are normal. She exhibits no distension and no mass. There is no tenderness. There is no rebound and no guarding.  Musculoskeletal: Normal range of motion. She exhibits no edema and no tenderness.  Lymphadenopathy:    She has no cervical adenopathy.  Neurological: She is  alert and oriented to person, place, and time. She has normal reflexes. No cranial nerve deficit. She exhibits normal muscle tone. Coordination normal.  Skin: Skin is warm and dry. No rash noted. No erythema.  Psychiatric: She has a normal mood and affect. Her behavior is normal. Judgment and thought content normal.          Assessment & Plan:  Well exam.

## 2012-01-09 ENCOUNTER — Telehealth: Payer: Self-pay | Admitting: Family Medicine

## 2012-01-09 MED ORDER — MELOXICAM 15 MG PO TABS: 15.0000 mg | ORAL_TABLET | Freq: Every day | ORAL | Status: DC

## 2012-01-09 NOTE — Telephone Encounter (Signed)
Refill request for Meloxicam 15 mg and a 90 day supply. I sent script e-scribe.

## 2012-02-04 ENCOUNTER — Encounter: Payer: Self-pay | Admitting: Family Medicine

## 2012-02-04 ENCOUNTER — Ambulatory Visit (INDEPENDENT_AMBULATORY_CARE_PROVIDER_SITE_OTHER): Payer: BC Managed Care – PPO | Admitting: Family Medicine

## 2012-02-04 VITALS — BP 110/70 | HR 107 | Temp 98.5°F | Wt 222.0 lb

## 2012-02-04 DIAGNOSIS — M545 Low back pain, unspecified: Secondary | ICD-10-CM

## 2012-02-04 MED ORDER — HYDROCODONE-ACETAMINOPHEN 7.5-500 MG PO TABS: 1.0000 | ORAL_TABLET | Freq: Four times a day (QID) | ORAL | Status: DC | PRN

## 2012-02-04 NOTE — Progress Notes (Signed)
  Subjective:    Patient ID: [name], female    DOB: 02-24-1976, 36 y.o.   MRN: [id number]  HPI Here for worsening lower back pain over the past few months. The pain is an aching pain in both sides of the lower back which does not radiate into the legs. No numbness or tingling or weakness in the legs. None of her other joints bother her very much. She is taking Meloxicam daily. She does have psoriasis, and she is getting Humira injections under the direction of Dr. Elmon Else, her dermatologist. She had plain Xrays of her spine last winter that were unremarkable.    Review of Systems  Constitutional: Negative.   Musculoskeletal: Positive for back pain. Negative for myalgias, joint swelling and arthralgias.       Objective:   Physical Exam  Constitutional: She appears well-developed and well-nourished. No distress.  Musculoskeletal:       She is not tender over the lower back but her ROM is limited by pain. 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 We will refer her to Rheumatology

## 2012-08-24 ENCOUNTER — Encounter: Payer: Self-pay | Admitting: Family Medicine

## 2012-08-24 ENCOUNTER — Ambulatory Visit (INDEPENDENT_AMBULATORY_CARE_PROVIDER_SITE_OTHER): Payer: BC Managed Care – PPO | Admitting: Family Medicine

## 2012-08-24 VITALS — BP 110/68 | HR 90 | Temp 98.3°F | Wt 220.0 lb

## 2012-08-24 DIAGNOSIS — J329 Chronic sinusitis, unspecified: Secondary | ICD-10-CM

## 2012-08-24 MED ORDER — AZITHROMYCIN 250 MG PO TABS: ORAL_TABLET | ORAL | Status: DC

## 2012-08-24 NOTE — Progress Notes (Signed)
  Subjective:    Patient ID: [name], female    DOB: 1976-08-14, 36 y.o.   MRN: [id number]  HPI Here for 2 weeks of sinus pressure, PND, ST, and a dry cough. No fever.    Review of Systems  Constitutional: Negative.   HENT: Positive for congestion, postnasal drip and sinus pressure.   Eyes: Negative.   Respiratory: Positive for cough.        Objective:   Physical Exam  Constitutional: She appears well-developed and well-nourished.  HENT:  Right Ear: External ear normal.  Left Ear: External ear normal.  Nose: Nose normal.  Mouth/Throat: Oropharynx is clear and moist.  Eyes: Conjunctivae normal are normal. Pupils are equal, round, and reactive to light.  Pulmonary/Chest: Effort normal and breath sounds normal.  Lymphadenopathy:    She has no cervical adenopathy.          Assessment & Plan:  Add Mucinex

## 2013-02-23 ENCOUNTER — Ambulatory Visit (INDEPENDENT_AMBULATORY_CARE_PROVIDER_SITE_OTHER): Payer: BC Managed Care – PPO | Admitting: Family Medicine

## 2013-02-23 ENCOUNTER — Encounter: Payer: Self-pay | Admitting: Family Medicine

## 2013-02-23 VITALS — BP 110/70 | HR 89 | Temp 98.0°F | Wt 218.0 lb

## 2013-02-23 DIAGNOSIS — L509 Urticaria, unspecified: Secondary | ICD-10-CM

## 2013-02-23 NOTE — Progress Notes (Signed)
  Subjective:    Patient ID: [name], female    DOB: 11-May-1976, 37 y.o.   MRN: [id number]  HPI Here for intermittent hives all over the body for the past 6 months. Benadryl helps for awhile. No new foods or detergents, etc. She has been on Humira for about 4 years now.    Review of Systems  Constitutional: Negative.   Skin: Positive for rash.       Objective:   Physical Exam  Constitutional: She appears well-developed and well-nourished.  Skin:  Her skin is clear today           Assessment & Plan:  Try 2 weeks of Zantac 150 mg bid and Zyrtec 10 mg bid. Recheck prn

## 2013-10-19 ENCOUNTER — Ambulatory Visit (INDEPENDENT_AMBULATORY_CARE_PROVIDER_SITE_OTHER): Payer: BC Managed Care – PPO | Admitting: Family Medicine

## 2013-10-19 ENCOUNTER — Encounter: Payer: Self-pay | Admitting: Family Medicine

## 2013-10-19 VITALS — BP 110/72 | Temp 99.0°F | Ht 65.75 in | Wt 236.0 lb

## 2013-10-19 DIAGNOSIS — N920 Excessive and frequent menstruation with regular cycle: Secondary | ICD-10-CM

## 2013-10-19 DIAGNOSIS — N390 Urinary tract infection, site not specified: Secondary | ICD-10-CM

## 2013-10-19 LAB — POCT URINALYSIS DIPSTICK
Bilirubin, UA: NEGATIVE
GLUCOSE UA: NEGATIVE
KETONES UA: NEGATIVE
Nitrite, UA: POSITIVE
SPEC GRAV UA: 1.025
UROBILINOGEN UA: 0.2
pH, UA: 6

## 2013-10-19 MED ORDER — CIPROFLOXACIN HCL 500 MG PO TABS: 500.0000 mg | ORAL_TABLET | Freq: Two times a day (BID) | ORAL | Status: DC

## 2013-10-19 NOTE — Progress Notes (Signed)
Pre visit review using our clinic review tool, if applicable. No additional management support is needed unless otherwise documented below in the visit note. 

## 2013-10-19 NOTE — Progress Notes (Signed)
   Subjective:    Patient ID: [name], female    DOB: 01/17/1976, 38 y.o.   MRN: [id number]  HPI Here for 3 days of urinary frequency and burning. No nausea or fever.    Review of Systems  Constitutional: Negative.   Genitourinary: Positive for dysuria, urgency and frequency. Negative for hematuria and pelvic pain.       Objective:   Physical Exam  Constitutional: She appears well-developed and well-nourished.  HENT:  Right Ear: External ear normal.  Left Ear: External ear normal.  Nose: Nose normal.  Mouth/Throat: Oropharynx is clear and moist.  Eyes: Conjunctivae are normal.  Abdominal: Soft. Bowel sounds are normal. She exhibits no distension and no mass. There is no tenderness. There is no rebound and no guarding.  Lymphadenopathy:    She has no cervical adenopathy.          Assessment & Plan:  Drink fluids, use Azo prn.

## 2013-10-22 LAB — URINE CULTURE

## 2014-03-17 ENCOUNTER — Other Ambulatory Visit (INDEPENDENT_AMBULATORY_CARE_PROVIDER_SITE_OTHER): Payer: BC Managed Care – PPO

## 2014-03-17 DIAGNOSIS — Z0000 Encounter for general adult medical examination without abnormal findings: Secondary | ICD-10-CM

## 2014-03-17 LAB — HEPATIC FUNCTION PANEL
ALBUMIN: 4 g/dL (ref 3.5–5.2)
ALK PHOS: 81 U/L (ref 39–117)
ALT: 17 U/L (ref 0–35)
AST: 17 U/L (ref 0–37)
BILIRUBIN DIRECT: 0.1 mg/dL (ref 0.0–0.3)
TOTAL PROTEIN: 7.4 g/dL (ref 6.0–8.3)
Total Bilirubin: 0.7 mg/dL (ref 0.2–1.2)

## 2014-03-17 LAB — BASIC METABOLIC PANEL
BUN: 19 mg/dL (ref 6–23)
CO2: 25 mEq/L (ref 19–32)
Calcium: 8.8 mg/dL (ref 8.4–10.5)
Chloride: 104 mEq/L (ref 96–112)
Creatinine, Ser: 0.7 mg/dL (ref 0.4–1.2)
GFR: 101.59 mL/min (ref >60.00)
Glucose, Bld: 98 mg/dL (ref 70–99)
POTASSIUM: 4.1 meq/L (ref 3.5–5.1)
SODIUM: 137 meq/L (ref 135–145)

## 2014-03-17 LAB — LIPID PANEL
CHOLESTEROL: 140 mg/dL (ref 0–200)
HDL: 41.9 mg/dL (ref >39.00)
LDL CALC: 78 mg/dL (ref 0–99)
NonHDL: 98.1
TRIGLYCERIDES: 101 mg/dL (ref 0.0–149.0)
Total CHOL/HDL Ratio: 3
VLDL: 20.2 mg/dL (ref 0.0–40.0)

## 2014-03-17 LAB — POCT URINALYSIS DIPSTICK
Bilirubin, UA: NEGATIVE
Glucose, UA: NEGATIVE
KETONES UA: NEGATIVE
Leukocytes, UA: NEGATIVE
Nitrite, UA: NEGATIVE
PH UA: 5.5
PROTEIN UA: NEGATIVE
Urobilinogen, UA: 0.2

## 2014-03-17 LAB — CBC WITH DIFFERENTIAL/PLATELET
Basophils Absolute: 0 10*3/uL (ref 0.0–0.1)
Basophils Relative: 0.2 % (ref 0.0–3.0)
EOS PCT: 3.2 % (ref 0.0–5.0)
Eosinophils Absolute: 0.3 10*3/uL (ref 0.0–0.7)
HEMATOCRIT: 42.4 % (ref 36.0–46.0)
Hemoglobin: 14.6 g/dL (ref 12.0–15.0)
LYMPHS ABS: 1.2 10*3/uL (ref 0.7–4.0)
LYMPHS PCT: 13 % (ref 12.0–46.0)
MCHC: 34.3 g/dL (ref 30.0–36.0)
MCV: 89.7 fl (ref 78.0–100.0)
MONOS PCT: 8.6 % (ref 3.0–12.0)
Monocytes Absolute: 0.8 10*3/uL (ref 0.1–1.0)
Neutro Abs: 6.9 10*3/uL (ref 1.4–7.7)
Neutrophils Relative %: 75 % (ref 43.0–77.0)
PLATELETS: 254 10*3/uL (ref 150.0–400.0)
RBC: 4.73 Mil/uL (ref 3.87–5.11)
RDW: 12.3 % (ref 11.5–15.5)
WBC: 9.2 10*3/uL (ref 4.0–10.5)

## 2014-03-17 LAB — TSH: TSH: 0.85 u[IU]/mL (ref 0.35–4.50)

## 2014-03-31 ENCOUNTER — Ambulatory Visit (INDEPENDENT_AMBULATORY_CARE_PROVIDER_SITE_OTHER): Payer: BC Managed Care – PPO | Admitting: Family Medicine

## 2014-03-31 ENCOUNTER — Encounter: Payer: Self-pay | Admitting: Family Medicine

## 2014-03-31 VITALS — BP 113/81 | HR 91 | Temp 98.9°F | Ht 65.75 in | Wt 240.0 lb

## 2014-03-31 DIAGNOSIS — Z0000 Encounter for general adult medical examination without abnormal findings: Secondary | ICD-10-CM

## 2014-03-31 NOTE — Progress Notes (Signed)
Pre visit review using our clinic review tool, if applicable. No additional management support is needed unless otherwise documented below in the visit note. 

## 2014-03-31 NOTE — Progress Notes (Signed)
   Subjective:    Patient ID: Meagan DollyJennifer L Preisler, female    DOB: 09/28/1976, 38 y.o.   MRN: [id number]  HPI 38 yr old female for a cpx. She feels great. Her psoriasis and arthritis are well controlled with Humira shots. She and her husband have a foster child and they need forms filled out. They are actually working to adopt this child.    Review of Systems  Constitutional: Negative.   HENT: Negative.   Eyes: Negative.   Respiratory: Negative.   Cardiovascular: Negative.   Gastrointestinal: Negative.   Genitourinary: Negative for dysuria, urgency, frequency, hematuria, flank pain, decreased urine volume, enuresis, difficulty urinating, pelvic pain and dyspareunia.  Musculoskeletal: Negative.   Skin: Negative.   Neurological: Negative.   Psychiatric/Behavioral: Negative.        Objective:   Physical Exam  Constitutional: She is oriented to person, place, and time. She appears well-developed and well-nourished. No distress.  HENT:  Head: Normocephalic and atraumatic.  Right Ear: External ear normal.  Left Ear: External ear normal.  Nose: Nose normal.  Mouth/Throat: Oropharynx is clear and moist. No oropharyngeal exudate.  Eyes: Conjunctivae and EOM are normal. Pupils are equal, round, and reactive to light. No scleral icterus.  Neck: Normal range of motion. Neck supple. No JVD present. No thyromegaly present.  Cardiovascular: Normal rate, regular rhythm, normal heart sounds and intact distal pulses.  Exam reveals no gallop and no friction rub.   No murmur heard. Pulmonary/Chest: Effort normal and breath sounds normal. No respiratory distress. She has no wheezes. She has no rales. She exhibits no tenderness.  Abdominal: Soft. Bowel sounds are normal. She exhibits no distension and no mass. There is no tenderness. There is no rebound and no guarding.  Musculoskeletal: Normal range of motion. She exhibits no edema and no tenderness.  Lymphadenopathy:    She has no cervical adenopathy.   Neurological: She is alert and oriented to person, place, and time. She has normal reflexes. No cranial nerve deficit. She exhibits normal muscle tone. Coordination normal.  Skin: Skin is warm and dry. No rash noted. No erythema.  Psychiatric: She has a normal mood and affect. Her behavior is normal. Judgment and thought content normal.          Assessment & Plan:  Well exam.

## 2014-08-09 ENCOUNTER — Ambulatory Visit
Admission: RE | Admit: 2014-08-09 | Discharge: 2014-08-09 | Disposition: A | Discharge status: Home / Self Care | Payer: BC Managed Care – PPO | Source: Ambulatory Visit | Attending: Internal Medicine | Admitting: Internal Medicine

## 2014-08-09 ENCOUNTER — Encounter: Payer: Self-pay | Admitting: Internal Medicine

## 2014-08-09 ENCOUNTER — Ambulatory Visit (INDEPENDENT_AMBULATORY_CARE_PROVIDER_SITE_OTHER): Payer: BC Managed Care – PPO | Admitting: Internal Medicine

## 2014-08-09 VITALS — BP 126/83 | HR 92 | Temp 98.3°F | Wt 231.0 lb

## 2014-08-09 DIAGNOSIS — R7612 Nonspecific reaction to cell mediated immunity measurement of gamma interferon antigen response without active tuberculosis: Secondary | ICD-10-CM

## 2014-08-09 NOTE — Progress Notes (Signed)
Subjective:    Patient ID: [name], female    DOB: 07/19/1976, 38 y.o.   MRN: [id number]  HPI 38yo F with psoriasis on humira who is referred to ID clinic for + quantiferon. She has had previous skin tests which were negative. A year ago she had QTF testing which was negative. She has been in good state of health denies any cough, fever, nightsweat, unexpected weight loss.   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   The lab work sent by her PCP offices shows positive quantiferon, but it is only marginal positive. 0.41 with cutoff of 0.35.   No Known Allergies   Current Outpatient Prescriptions on File Prior to Visit  Medication Sig Dispense Refill  . Adalimumab (HUMIRA PEN [place]) Inject 1 pen into the skin. Every other week      No current facility-administered medications on file prior to visit.   Active Ambulatory Problems    Diagnosis Date Noted  . FEVER BLISTER 11/14/2008  . ACUTE BRONCHITIS 11/14/2008  . GERD 11/11/2007  . PSORIASIS 11/11/2007  . HEADACHE 11/11/2007   Resolved Ambulatory Problems    Diagnosis Date Noted  . No Resolved Ambulatory Problems   Past Medical History  Diagnosis Date  . Psoriasis   . Arthritis   . Gynecological examination    History  Substance Use Topics  . Smoking status: Never Smoker   . Smokeless tobacco: Never Used  . Alcohol Use: Yes     Comment: occ  family history is not on file.   Review of Systems Review of Systems  Constitutional: Negative for fever, chills, diaphoresis, activity change, appetite change, fatigue and unexpected weight change.  HENT: Negative for congestion, sore throat, rhinorrhea, sneezing, trouble swallowing and sinus pressure.  Eyes: Negative for photophobia and visual disturbance.  Respiratory: Negative for cough, chest tightness, shortness of breath, wheezing and stridor.  Cardiovascular:  Negative for chest pain, palpitations and leg swelling.  Gastrointestinal: Negative for nausea, vomiting, abdominal pain, diarrhea, constipation, blood in stool, abdominal distention and anal bleeding.  Genitourinary: Negative for dysuria, hematuria, flank pain and difficulty urinating.  Musculoskeletal: Negative for myalgias, back pain, joint swelling, arthralgias and gait problem.  Skin: Negative for color change, pallor, rash and wound.  Neurological: Negative for dizziness, tremors, weakness and light-headedness.  Hematological: Negative for adenopathy. Does not bruise/bleed easily.  Psychiatric/Behavioral: Negative for behavioral problems, confusion, sleep disturbance, dysphoric mood, decreased concentration and agitation.       Objective:   Physical Exam BP 126/83 mmHg  Pulse 92  Temp(Src) 98.3 F (36.8 C) (Oral)  Wt 231 lb (104.781 kg)  LMP 08/05/2014 Physical Exam  Constitutional:  oriented to person, place, and time. appears well-developed and well-nourished. No distress.  HENT:  Mouth/Throat: Oropharynx is clear and moist. No oropharyngeal exudate.  Cardiovascular: Normal rate, regular rhythm and normal heart sounds. Exam reveals no gallop and no friction rub.  No murmur heard.  Pulmonary/Chest: Effort normal and breath sounds normal. No respiratory distress.  has no wheezes.  Abdominal: Soft. Bowel sounds are normal.  exhibits no distension. There is no tenderness.  Lymphadenopathy: no cervical adenopathy.  Neurological: alert and oriented to person, place, and time.  Skin: Skin is warm and dry. No rash noted. No erythema.  Psychiatric: a normal mood and affect. behavior is normal.   Imaging : cxr from this visit is normal.  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 Will recommend to repeat QTF, hiv testing, cxr today. If QTF is negative, will not offer for her to have LTBI. If it is positive again, then likely  her test was in process of seroconversion. She does not appear to have recent exposure and past studies had been negative.  Defer treatment until we will get results  ----------------------- Addendum : quantiferon was negaitve

## 2014-08-10 LAB — HIV ANTIBODY (ROUTINE TESTING W REFLEX): HIV: NONREACTIVE

## 2014-08-11 LAB — QUANTIFERON TB GOLD ASSAY (BLOOD)
Interferon Gamma Release Assay: NEGATIVE
Mitogen value: 8.31 IU/mL
Quantiferon Nil Value: 0.04 IU/mL
Quantiferon Tb Ag Minus Nil Value: 0.03 IU/mL
TB Ag value: 0.07 IU/mL

## 2014-08-12 ENCOUNTER — Telehealth: Payer: Self-pay | Admitting: *Deleted

## 2014-08-12 NOTE — Telephone Encounter (Signed)
Patient called with questions on voice mail left by Dr. Drue SecondSnider. She wants to know results of chest xray, whether she needs to keep January's appointment. And if our office will inform the dermatologist about results or if she needs to herself. We can forward the office note to Dr. Emily FilbertGould when complete.

## 2014-10-04 ENCOUNTER — Ambulatory Visit: Payer: BC Managed Care – PPO | Admitting: Internal Medicine

## 2014-11-07 ENCOUNTER — Ambulatory Visit (INDEPENDENT_AMBULATORY_CARE_PROVIDER_SITE_OTHER): Payer: BC Managed Care – PPO | Admitting: Family Medicine

## 2014-11-07 ENCOUNTER — Encounter: Payer: Self-pay | Admitting: Family Medicine

## 2014-11-07 VITALS — BP 116/90 | HR 87 | Temp 98.5°F | Ht 65.75 in | Wt 238.0 lb

## 2014-11-07 DIAGNOSIS — J0100 Acute maxillary sinusitis, unspecified: Secondary | ICD-10-CM

## 2014-11-07 MED ORDER — AZITHROMYCIN 250 MG PO TABS: ORAL_TABLET | ORAL | Status: DC

## 2014-11-07 NOTE — Progress Notes (Signed)
   Subjective:    Patient ID: [name], female    DOB: 08/21/1976, 39 y.o.   MRN: [id number]  HPI Here for 2 weeks of sinus pressure, PND, and dry cough.    Review of Systems  Constitutional: Negative.   HENT: Positive for congestion, postnasal drip and sinus pressure.   Eyes: Negative.   Respiratory: Positive for cough.        Objective:   Physical Exam  Constitutional: She appears well-developed and well-nourished.  HENT:  Right Ear: External ear normal.  Left Ear: External ear normal.  Nose: Nose normal.  Eyes: Conjunctivae are normal.  Neck:  Several swollen right AC nodes   Pulmonary/Chest: Effort normal and breath sounds normal.          Assessment & Plan:  Add Mucinex

## 2014-11-07 NOTE — Progress Notes (Signed)
Pre visit review using our clinic review tool, if applicable. No additional management support is needed unless otherwise documented below in the visit note. 

## 2016-01-25 ENCOUNTER — Other Ambulatory Visit: Payer: BC Managed Care – PPO

## 2016-01-30 ENCOUNTER — Encounter: Payer: BC Managed Care – PPO | Admitting: Family Medicine

## 2016-03-04 ENCOUNTER — Encounter: Payer: BC Managed Care – PPO | Admitting: Family Medicine

## 2016-03-14 ENCOUNTER — Other Ambulatory Visit (INDEPENDENT_AMBULATORY_CARE_PROVIDER_SITE_OTHER): Payer: BC Managed Care – PPO

## 2016-03-14 DIAGNOSIS — Z0000 Encounter for general adult medical examination without abnormal findings: Secondary | ICD-10-CM

## 2016-03-14 LAB — LIPID PANEL
CHOLESTEROL: 135 mg/dL (ref 0–200)
HDL: 35.1 mg/dL — ABNORMAL LOW (ref >39.00)
LDL Cholesterol: 77 mg/dL (ref 0–99)
NonHDL: 100.32
TRIGLYCERIDES: 118 mg/dL (ref 0.0–149.0)
Total CHOL/HDL Ratio: 4
VLDL: 23.6 mg/dL (ref 0.0–40.0)

## 2016-03-14 LAB — HEPATIC FUNCTION PANEL
ALBUMIN: 3.9 g/dL (ref 3.5–5.2)
ALT: 15 U/L (ref 0–35)
AST: 14 U/L (ref 0–37)
Alkaline Phosphatase: 78 U/L (ref 39–117)
BILIRUBIN TOTAL: 0.5 mg/dL (ref 0.2–1.2)
Bilirubin, Direct: 0.1 mg/dL (ref 0.0–0.3)
Total Protein: 7.3 g/dL (ref 6.0–8.3)

## 2016-03-14 LAB — POC URINALSYSI DIPSTICK (AUTOMATED)
BILIRUBIN UA: NEGATIVE
GLUCOSE UA: NEGATIVE
Ketones, UA: NEGATIVE
NITRITE UA: NEGATIVE
Spec Grav, UA: 1.025
Urobilinogen, UA: 0.2
pH, UA: 5.5

## 2016-03-14 LAB — CBC WITH DIFFERENTIAL/PLATELET
BASOS PCT: 0.4 % (ref 0.0–3.0)
Basophils Absolute: 0 10*3/uL (ref 0.0–0.1)
EOS PCT: 4.1 % (ref 0.0–5.0)
Eosinophils Absolute: 0.3 10*3/uL (ref 0.0–0.7)
HCT: 42.3 % (ref 36.0–46.0)
Hemoglobin: 14.6 g/dL (ref 12.0–15.0)
LYMPHS ABS: 2.3 10*3/uL (ref 0.7–4.0)
Lymphocytes Relative: 37.6 % (ref 12.0–46.0)
MCHC: 34.5 g/dL (ref 30.0–36.0)
MCV: 88.6 fl (ref 78.0–100.0)
MONO ABS: 0.9 10*3/uL (ref 0.1–1.0)
Monocytes Relative: 14.1 % — ABNORMAL HIGH (ref 3.0–12.0)
NEUTROS PCT: 43.8 % (ref 43.0–77.0)
Neutro Abs: 2.7 10*3/uL (ref 1.4–7.7)
Platelets: 279 10*3/uL (ref 150.0–400.0)
RBC: 4.78 Mil/uL (ref 3.87–5.11)
RDW: 12.7 % (ref 11.5–15.5)
WBC: 6.1 10*3/uL (ref 4.0–10.5)

## 2016-03-14 LAB — BASIC METABOLIC PANEL
BUN: 20 mg/dL (ref 6–23)
CHLORIDE: 106 meq/L (ref 96–112)
CO2: 27 meq/L (ref 19–32)
Calcium: 8.8 mg/dL (ref 8.4–10.5)
Creatinine, Ser: 0.71 mg/dL (ref 0.40–1.20)
GFR: 97.26 mL/min (ref >60.00)
GLUCOSE: 94 mg/dL (ref 70–99)
POTASSIUM: 3.9 meq/L (ref 3.5–5.1)
Sodium: 139 mEq/L (ref 135–145)

## 2016-03-14 LAB — TSH: TSH: 2.26 u[IU]/mL (ref 0.35–4.50)

## 2016-03-18 ENCOUNTER — Ambulatory Visit (INDEPENDENT_AMBULATORY_CARE_PROVIDER_SITE_OTHER): Payer: BC Managed Care – PPO | Admitting: Family Medicine

## 2016-03-18 ENCOUNTER — Encounter: Payer: Self-pay | Admitting: Family Medicine

## 2016-03-18 VITALS — BP 109/77 | HR 78 | Temp 98.3°F | Ht 65.75 in | Wt 227.0 lb

## 2016-03-18 DIAGNOSIS — Z0000 Encounter for general adult medical examination without abnormal findings: Secondary | ICD-10-CM | POA: Diagnosis not present

## 2016-03-18 MED ORDER — CIPROFLOXACIN HCL 500 MG PO TABS: 500.0000 mg | ORAL_TABLET | Freq: Two times a day (BID) | ORAL | Status: DC

## 2016-03-18 NOTE — Progress Notes (Signed)
Pre visit review using our clinic review tool, if applicable. No additional management support is needed unless otherwise documented below in the visit note. 

## 2016-03-18 NOTE — Progress Notes (Signed)
   Subjective:    Patient ID: [name], female    DOB: 01/10/1976, 40 y.o.   MRN: [id number]  HPI 40 yr old female for [name] well exam. She feels fine except for several days of mild burning on urination. No urgency. Her cpx labs did show 1+ leukocytes. Her psoriasis remains under good control.    Review of Systems  Constitutional: Negative.   HENT: Negative.   Eyes: Negative.   Respiratory: Negative.   Cardiovascular: Negative.   Gastrointestinal: Negative.   Genitourinary: Positive for dysuria. Negative for urgency, frequency, hematuria, flank pain, decreased urine volume, enuresis, difficulty urinating, pelvic pain and dyspareunia.  Musculoskeletal: Negative.   Skin: Negative.   Neurological: Negative.   Psychiatric/Behavioral: Negative.        Objective:   Physical Exam  Constitutional: She is oriented to person, place, and time. She appears well-developed and well-nourished. No distress.  HENT:  Head: Normocephalic and atraumatic.  Right Ear: External ear normal.  Left Ear: External ear normal.  Nose: Nose normal.  Mouth/Throat: Oropharynx is clear and moist. No oropharyngeal exudate.  Eyes: Conjunctivae and EOM are normal. Pupils are equal, round, and reactive to light. No scleral icterus.  Neck: Normal range of motion. Neck supple. No JVD present. No thyromegaly present.  Cardiovascular: Normal rate, regular rhythm, normal heart sounds and intact distal pulses.  Exam reveals no gallop and no friction rub.   No murmur heard. Pulmonary/Chest: Effort normal and breath sounds normal. No respiratory distress. She has no wheezes. She has no rales. She exhibits no tenderness.  Abdominal: Soft. Bowel sounds are normal. She exhibits no distension and no mass. There is no tenderness. There is no rebound and no guarding.  Musculoskeletal: Normal range of motion. She exhibits no edema or tenderness.  Lymphadenopathy:    She has no cervical adenopathy.  Neurological: She is  alert and oriented to person, place, and time. She has normal reflexes. No cranial nerve deficit. She exhibits normal muscle tone. Coordination normal.  Skin: Skin is warm and dry. No rash noted. No erythema.  Psychiatric: She has [name] normal mood and affect. Her behavior is normal. Judgment and thought content normal.          Assessment & Plan:  Well exam. We discussed diet and exercise. Treat the UTI with Cipro.  Nelwyn SalisburyFRY,Meagan Kingsbury A, MD

## 2016-08-05 ENCOUNTER — Ambulatory Visit (INDEPENDENT_AMBULATORY_CARE_PROVIDER_SITE_OTHER): Payer: BC Managed Care – PPO | Admitting: Family Medicine

## 2016-08-05 ENCOUNTER — Encounter: Payer: Self-pay | Admitting: Family Medicine

## 2016-08-05 VITALS — BP 111/84 | HR 86 | Temp 98.3°F | Ht 65.75 in | Wt 238.0 lb

## 2016-08-05 DIAGNOSIS — J019 Acute sinusitis, unspecified: Secondary | ICD-10-CM | POA: Diagnosis not present

## 2016-08-05 MED ORDER — LEVOFLOXACIN 500 MG PO TABS: 500.0000 mg | ORAL_TABLET | Freq: Every day | ORAL | 0 refills | Status: AC

## 2016-08-05 NOTE — Progress Notes (Signed)
Pre visit review using our clinic review tool, if applicable. No additional management support is needed unless otherwise documented below in the visit note. 

## 2016-08-05 NOTE — Progress Notes (Signed)
   Subjective:    Patient ID: [name], female    DOB: 08/25/1976, 40 y.o.   MRN: [id number]  HPI Here for 5 days of sinus pressure, PND, ST, and a dry cough. On Mucinex.    Review of Systems  Constitutional: Negative.   HENT: Positive for congestion, postnasal drip, sinus pressure and sore throat. Negative for ear pain.   Eyes: Negative.   Respiratory: Positive for cough.        Objective:   Physical Exam  Constitutional: She appears well-developed and well-nourished.  HENT:  Right Ear: External ear normal.  Left Ear: External ear normal.  Nose: Nose normal.  Mouth/Throat: Oropharynx is clear and moist.  Her voice is hoarse   Eyes: Conjunctivae are normal.  Neck: No thyromegaly present.  Pulmonary/Chest: Effort normal and breath sounds normal.  Lymphadenopathy:    She has no cervical adenopathy.          Assessment & Plan:  Sinusitis, treat with Levaquin.  Nelwyn SalisburyFRY,Kesia Dalto A, MD

## 2016-08-06 ENCOUNTER — Telehealth: Payer: Self-pay | Admitting: Family Medicine

## 2016-08-06 NOTE — Telephone Encounter (Signed)
° ° °  Pt call to say he insurance will not pay for the following med. She is asking if something else can be called in   levofloxacin (LEVAQUIN) 500 MG tablet   Ph[facility]acy [facility] [address]

## 2016-08-06 NOTE — Telephone Encounter (Signed)
Per Dr. Clent RidgesFry order Augmentin 875 mg take 1 po bid for 10 days and 0 refills.

## 2016-08-07 MED ORDER — AMOXICILLIN-POT CLAVULANATE 875-125 MG PO TABS: 1.0000 | ORAL_TABLET | Freq: Two times a day (BID) | ORAL | 0 refills | Status: DC

## 2016-08-07 NOTE — Telephone Encounter (Signed)
Rx has been sent in and voicemail left on patient's phone letting her know.

## 2017-07-11 ENCOUNTER — Ambulatory Visit: Payer: BC Managed Care – PPO | Admitting: Family Medicine

## 2017-07-14 ENCOUNTER — Encounter: Payer: Self-pay | Admitting: Family Medicine

## 2017-07-14 ENCOUNTER — Ambulatory Visit (INDEPENDENT_AMBULATORY_CARE_PROVIDER_SITE_OTHER): Payer: BC Managed Care – PPO | Admitting: Family Medicine

## 2017-07-14 VITALS — BP 118/82 | HR 96 | Temp 98.6°F | Ht 65.75 in | Wt 242.0 lb

## 2017-07-14 DIAGNOSIS — J0101 Acute recurrent maxillary sinusitis: Secondary | ICD-10-CM | POA: Diagnosis not present

## 2017-07-14 MED ORDER — LEVOFLOXACIN 500 MG PO TABS: 500.0000 mg | ORAL_TABLET | Freq: Every day | ORAL | 0 refills | Status: AC

## 2017-07-14 NOTE — Patient Instructions (Signed)
WE NOW OFFER   [facility] FAST TRACK!!!  SAME DAY Appointments for ACUTE CARE  Such as: Sprains, Injuries, cuts, abrasions, rashes, muscle pain, joint pain, back pain Colds, flu, sore throats, headache, allergies, cough, fever  Ear pain, sinus and eye infections Abdominal pain, nausea, vomiting, diarrhea, upset stomach Animal/insect bites  3 Easy Ways to Schedule: Walk-In Scheduling Call in scheduling Mychart Sign-up: [email or web address]         

## 2017-07-14 NOTE — Progress Notes (Signed)
   Subjective:    Patient ID: [name], female    DOB: [birth year], 41 y.o.   MRN: [id number]  HPI Here for recurrent sinusitis symptoms that started in late August. She has congestion, PND, and a dry cough. No fever. Then she may develop hoarseness and lose her voice. She took a 10 day course of Levaquin a month ago and this helped a good deal, but her symptoms returned a week later.    Review of Systems  Constitutional: Negative.   HENT: Positive for congestion, postnasal drip, sinus pain, sinus pressure and voice change. Negative for sore throat.   Eyes: Negative.   Respiratory: Positive for cough.        Objective:   Physical Exam  Constitutional: She appears well-developed and well-nourished.  Her voice is very hoarse   HENT:  Right Ear: External ear normal.  Left Ear: External ear normal.  Nose: Nose normal.  Mouth/Throat: Oropharynx is clear and moist.  Eyes: Conjunctivae are normal.  Neck: No thyromegaly present.  Pulmonary/Chest: Effort normal and breath sounds normal. No respiratory distress. She has no wheezes. She has no rales.  Lymphadenopathy:    She has no cervical adenopathy.          Assessment & Plan:  Partially treated sinusitis. Treat with 14 days of Levaquin 500 mg daily. Add Flonase and Xyzal daily.  Gershon Crane, MD

## 2017-07-31 ENCOUNTER — Telehealth: Payer: Self-pay | Admitting: Family Medicine

## 2017-07-31 NOTE — Telephone Encounter (Signed)
Pt state that her sinus infection has not gotten any better and Dr. Clent RidgesFry state he would give her another round of antibiotics.  [facility] [address]

## 2017-07-31 NOTE — Telephone Encounter (Signed)
Call in Levaquin 500 mg daily #14

## 2017-08-01 MED ORDER — LEVOFLOXACIN 500 MG PO TABS: 500.0000 mg | ORAL_TABLET | Freq: Every day | ORAL | 0 refills | Status: DC

## 2017-08-01 NOTE — Telephone Encounter (Signed)
[facility] and left a voice message for pt with this information.  

## 2017-08-06 ENCOUNTER — Other Ambulatory Visit: Payer: Self-pay

## 2017-08-06 ENCOUNTER — Encounter (HOSPITAL_BASED_OUTPATIENT_CLINIC_OR_DEPARTMENT_OTHER): Payer: Self-pay | Admitting: *Deleted

## 2017-08-06 NOTE — H&P (Signed)
Patient name  [name], [name] DICTATION# [id number] CSN# [id number]  [facility],[name], MD 08/06/2017 8:39 AM

## 2017-08-06 NOTE — H&P (Signed)
[name]:  Forsee, Joselyn             ACCOUNT NO.:  [id number]  MEDICAL RECORD NO.:  [id number]  LOCATION:                                 FACILITY:  PHYSICIAN:  Juluis MireJohn S. Lior Hoen, M.D.        DATE OF BIRTH:  DATE OF ADMISSION: DATE OF DISCHARGE:                             HISTORY & PHYSICAL   DATE OF SURGERY:  [facility] on [address].  HISTORY AND PHYSICAL:  The patient is a 41 year old, gravida 3, para 1, abortus 2 female, presents for diagnostic laparoscopy.  In relation to present admission, the patient has been having increasing pain and discomfort associated with her cycles.  She is also having increasing pain with intercourse.  It had been fairly intermittent and recent onset.  She underwent an ultrasound evaluation.  She had 2 complex cysts of the ovaries, possible endometriosis versus hemorrhagic cyst.  Because of these issues, she now proceeds for laparoscopic evaluation and removal of possible endometriomas.  The right ovary endometrioma measures 3.6, the left ovary 2.5.  Because of the pain, she wants operative intervention.  We had discussed other options including suppression only versus surgical management.  ALLERGIES:  In terms of allergies, she has no known drug allergies.  MEDICATIONS:  She is on Humira.  PAST MEDICAL HISTORY:  She has a history of psoriasis, for which she is on Humira.  She had gestational diabetes with her 1 pregnancy.  She has had a history of primary infertility.  PAST SURGICAL HISTORY:  She has had a C-section and a D and C.  FAMILY HISTORY:  There is a history of diabetes, arthritis.  SOCIAL HISTORY:  Minimal alcohol and no tobacco use.  REVIEW OF SYSTEMS:  Noncontributory.  PHYSICAL EXAMINATION:  VITAL SIGNS:  The patient is afebrile with stable vital signs. HEENT:  The patient is normocephalic.  Pupils equal, round, and reactive to light and accommodation.  Extraocular movements  are intact.  Sclerae and conjunctivae are clear.  Oropharynx clear. LUNGS:  Clear. CARDIOVASCULAR SYSTEM:  Regular rate.  No murmurs or gallops. ABDOMEN:  Benign.  No mass, organomegaly, or tenderness. PELVIC:  Normal external genitalia.  Vaginal mucosa is clear.  Cervix unremarkable.  Uterus of normal size, shape, and contour.  Mild adnexal tenderness.  No masses appreciated. EXTREMITIES:  Trace edema. NEUROLOGIC:  Grossly within normal limits.  IMPRESSION: 1. Dyspareunia associated pelvic pain. 2. Ultrasound suggestive of possible bilateral endometriomas. 3. Psoriatic arthritis, on Humira.  PLAN:  The patient will undergo diagnostic laparoscopy with removal of bilateral endometriomas.  The risks of surgery have been discussed including the risk of infection.  The risk of hemorrhage could require transfusion with the risk of AIDS or hepatitis.  Risk of injury to adjacent organs such as bladder, bowel, ureters that could require further exploratory surgery.  Risk of deep venous thrombosis and pulmonary embolus.  Discussed possible persistence of pain despite surgical management.  Some injury to organs could require hysterectomy. The patient expressed understanding of the indications and risks.     Juluis MireJohn S. Sherry Rogus, M.D.     JSM/MEDQ  D:  08/06/2017  T:  08/06/2017  Job:  [id number]

## 2017-08-06 NOTE — Progress Notes (Signed)
NPO AFTER MN.  ARRIVE AT 0530.  GETTING CBC AND hCG SERUM DONE [facility] Thursday AFTERNOON AND RESULTS WILL BE FAXED.

## 2017-08-11 ENCOUNTER — Ambulatory Visit (HOSPITAL_BASED_OUTPATIENT_CLINIC_OR_DEPARTMENT_OTHER)
Admission: RE | Admit: 2017-08-11 | Discharge: 2017-08-11 | Disposition: A | Discharge status: Home / Self Care | Payer: BC Managed Care – PPO | Source: Ambulatory Visit | Attending: Obstetrics and Gynecology | Admitting: Obstetrics and Gynecology

## 2017-08-11 ENCOUNTER — Encounter (HOSPITAL_BASED_OUTPATIENT_CLINIC_OR_DEPARTMENT_OTHER)
Admission: RE | Disposition: A | Discharge status: Home / Self Care | Payer: Self-pay | Source: Ambulatory Visit | Attending: Obstetrics and Gynecology

## 2017-08-11 ENCOUNTER — Ambulatory Visit (HOSPITAL_BASED_OUTPATIENT_CLINIC_OR_DEPARTMENT_OTHER): Payer: BC Managed Care – PPO | Admitting: Anesthesiology

## 2017-08-11 ENCOUNTER — Encounter (HOSPITAL_BASED_OUTPATIENT_CLINIC_OR_DEPARTMENT_OTHER): Payer: Self-pay

## 2017-08-11 DIAGNOSIS — K219 Gastro-esophageal reflux disease without esophagitis: Secondary | ICD-10-CM | POA: Insufficient documentation

## 2017-08-11 DIAGNOSIS — R51 Headache: Secondary | ICD-10-CM | POA: Insufficient documentation

## 2017-08-11 DIAGNOSIS — N736 Female pelvic peritoneal adhesions (postinfective): Secondary | ICD-10-CM | POA: Diagnosis not present

## 2017-08-11 HISTORY — DX: Chronic sinusitis, unspecified: J32.9

## 2017-08-11 HISTORY — PX: LAPAROSCOPY: SHX197

## 2017-08-11 HISTORY — DX: Other seasonal allergic rhinitis: J30.2

## 2017-08-11 HISTORY — DX: Pelvic and perineal pain unspecified side: R10.20

## 2017-08-11 HISTORY — DX: Pelvic and perineal pain: R10.2

## 2017-08-11 HISTORY — DX: Presence of spectacles and contact lenses: Z97.3

## 2017-08-11 HISTORY — DX: Personal history of other complications of pregnancy, childbirth and the puerperium: Z87.59

## 2017-08-11 SURGERY — LAPAROSCOPY, DIAGNOSTIC
Anesthesia: General

## 2017-08-11 MED ORDER — CEFAZOLIN SODIUM-DEXTROSE 2-4 GM/100ML-% IV SOLN
2.0000 g | INTRAVENOUS | Status: AC
  Administered 2017-08-11: 2 g via INTRAVENOUS
  Filled 2017-08-11: qty 100

## 2017-08-11 MED ORDER — SUGAMMADEX SODIUM 200 MG/2ML IV SOLN
INTRAVENOUS | Status: AC
  Filled 2017-08-11: qty 2

## 2017-08-11 MED ORDER — MEPERIDINE HCL 25 MG/ML IJ SOLN
6.2500 mg | INTRAMUSCULAR | Status: DC | PRN
  Filled 2017-08-11: qty 1

## 2017-08-11 MED ORDER — DEXAMETHASONE SODIUM PHOSPHATE 10 MG/ML IJ SOLN
INTRAMUSCULAR | Status: AC
  Filled 2017-08-11: qty 1

## 2017-08-11 MED ORDER — OXYCODONE HCL 5 MG PO TABS
5.0000 mg | ORAL_TABLET | Freq: Once | ORAL | Status: DC | PRN
  Filled 2017-08-11: qty 1

## 2017-08-11 MED ORDER — FENTANYL CITRATE (PF) 100 MCG/2ML IJ SOLN
INTRAMUSCULAR | Status: DC | PRN
  Administered 2017-08-11 (×2): 50 ug via INTRAVENOUS

## 2017-08-11 MED ORDER — SCOPOLAMINE 1 MG/3DAYS TD PT72
MEDICATED_PATCH | TRANSDERMAL | Status: AC
  Filled 2017-08-11: qty 1

## 2017-08-11 MED ORDER — MIDAZOLAM HCL 2 MG/2ML IJ SOLN
INTRAMUSCULAR | Status: AC
  Filled 2017-08-11: qty 2

## 2017-08-11 MED ORDER — KETOROLAC TROMETHAMINE 30 MG/ML IJ SOLN
INTRAMUSCULAR | Status: DC | PRN
  Administered 2017-08-11: 30 mg via INTRAVENOUS

## 2017-08-11 MED ORDER — OXYCODONE-ACETAMINOPHEN 7.5-325 MG PO TABS: 1.0000 | ORAL_TABLET | ORAL | 0 refills | Status: DC | PRN

## 2017-08-11 MED ORDER — MIDAZOLAM HCL 2 MG/2ML IJ SOLN
INTRAMUSCULAR | Status: DC | PRN
  Administered 2017-08-11: 2 mg via INTRAVENOUS

## 2017-08-11 MED ORDER — ONDANSETRON HCL 4 MG/2ML IJ SOLN
INTRAMUSCULAR | Status: AC
  Filled 2017-08-11: qty 2

## 2017-08-11 MED ORDER — DEXAMETHASONE SODIUM PHOSPHATE 10 MG/ML IJ SOLN
INTRAMUSCULAR | Status: DC | PRN
  Administered 2017-08-11: 10 mg via INTRAVENOUS

## 2017-08-11 MED ORDER — OXYCODONE HCL 5 MG/5ML PO SOLN
5.0000 mg | Freq: Once | ORAL | Status: DC | PRN
  Filled 2017-08-11: qty 5

## 2017-08-11 MED ORDER — LACTATED RINGERS IV SOLN
INTRAVENOUS | Status: DC
  Administered 2017-08-11: 1000 mL via INTRAVENOUS
  Filled 2017-08-11: qty 1000

## 2017-08-11 MED ORDER — LACTATED RINGERS IV SOLN
INTRAVENOUS | Status: DC | PRN
  Administered 2017-08-11: 07:00:00 via INTRAVENOUS

## 2017-08-11 MED ORDER — ARTIFICIAL TEARS OPHTHALMIC OINT
TOPICAL_OINTMENT | OPHTHALMIC | Status: AC
  Filled 2017-08-11: qty 3.5

## 2017-08-11 MED ORDER — LACTATED RINGERS IV SOLN
INTRAVENOUS | Status: DC
  Administered 2017-08-11: 10:00:00 via INTRAVENOUS
  Filled 2017-08-11: qty 1000

## 2017-08-11 MED ORDER — BUPIVACAINE HCL 0.25 % IJ SOLN
INTRAMUSCULAR | Status: DC | PRN
  Administered 2017-08-11: 7 mL

## 2017-08-11 MED ORDER — PROMETHAZINE HCL 25 MG/ML IJ SOLN
6.2500 mg | INTRAMUSCULAR | Status: DC | PRN
  Filled 2017-08-11: qty 1

## 2017-08-11 MED ORDER — SUGAMMADEX SODIUM 200 MG/2ML IV SOLN
INTRAVENOUS | Status: DC | PRN
  Administered 2017-08-11: 200 mg via INTRAVENOUS

## 2017-08-11 MED ORDER — ONDANSETRON HCL 4 MG/2ML IJ SOLN
INTRAMUSCULAR | Status: DC | PRN
  Administered 2017-08-11: 4 mg via INTRAVENOUS

## 2017-08-11 MED ORDER — PROPOFOL 10 MG/ML IV BOLUS
INTRAVENOUS | Status: DC | PRN
  Administered 2017-08-11: 200 mg via INTRAVENOUS

## 2017-08-11 MED ORDER — FENTANYL CITRATE (PF) 100 MCG/2ML IJ SOLN
INTRAMUSCULAR | Status: AC
  Filled 2017-08-11: qty 2

## 2017-08-11 MED ORDER — LIDOCAINE 2% (20 MG/ML) 5 ML SYRINGE
INTRAMUSCULAR | Status: DC | PRN
  Administered 2017-08-11: 60 mg via INTRAVENOUS

## 2017-08-11 MED ORDER — ROCURONIUM BROMIDE 50 MG/5ML IV SOSY
PREFILLED_SYRINGE | INTRAVENOUS | Status: AC
  Filled 2017-08-11: qty 5

## 2017-08-11 MED ORDER — CEFAZOLIN SODIUM-DEXTROSE 2-4 GM/100ML-% IV SOLN
INTRAVENOUS | Status: AC
  Filled 2017-08-11: qty 100

## 2017-08-11 MED ORDER — PROPOFOL 10 MG/ML IV BOLUS
INTRAVENOUS | Status: AC
  Filled 2017-08-11: qty 40

## 2017-08-11 MED ORDER — HYDROMORPHONE HCL 1 MG/ML IJ SOLN
0.2500 mg | INTRAMUSCULAR | Status: DC | PRN
Start: 2017-08-11 — End: 2017-08-11
  Filled 2017-08-11: qty 0.5

## 2017-08-11 MED ORDER — SCOPOLAMINE 1 MG/3DAYS TD PT72
MEDICATED_PATCH | TRANSDERMAL | Status: DC | PRN
  Administered 2017-08-11: 1 via TRANSDERMAL

## 2017-08-11 MED ORDER — LIDOCAINE 2% (20 MG/ML) 5 ML SYRINGE
INTRAMUSCULAR | Status: AC
  Filled 2017-08-11: qty 5

## 2017-08-11 MED ORDER — ROCURONIUM BROMIDE 10 MG/ML (PF) SYRINGE
PREFILLED_SYRINGE | INTRAVENOUS | Status: DC | PRN
  Administered 2017-08-11: 10 mg via INTRAVENOUS
  Administered 2017-08-11: 50 mg via INTRAVENOUS

## 2017-08-11 MED ORDER — KETOROLAC TROMETHAMINE 30 MG/ML IJ SOLN
INTRAMUSCULAR | Status: AC
  Filled 2017-08-11: qty 1

## 2017-08-11 SURGICAL SUPPLY — 37 items
APPLICATOR COTTON TIP 6IN STRL (MISCELLANEOUS) IMPLANT
CANISTER SUCT 3000ML PPV (MISCELLANEOUS) IMPLANT
CANISTER SUCTION 1200CC (MISCELLANEOUS) IMPLANT
CATH ROBINSON RED A/P 16FR (CATHETERS) ×3 IMPLANT
CLOTH BEACON ORANGE TIMEOUT ST (SAFETY) IMPLANT
COVER MAYO STAND STRL (DRAPES) ×3 IMPLANT
DERMABOND ADVANCED (GAUZE/BANDAGES/DRESSINGS) ×2
DERMABOND ADVANCED .7 DNX12 (GAUZE/BANDAGES/DRESSINGS) ×1 IMPLANT
DRSG COVADERM PLUS 2X2 (GAUZE/BANDAGES/DRESSINGS) ×3 IMPLANT
DRSG OPSITE POSTOP 3X4 (GAUZE/BANDAGES/DRESSINGS) ×3 IMPLANT
GLOVE BIO SURGEON STRL SZ7 (GLOVE) ×6 IMPLANT
GOWN STRL REUS W/TWL XL LVL3 (GOWN DISPOSABLE) ×3 IMPLANT
IV NS 1000ML (IV SOLUTION) ×4
IV NS 1000ML BAXH (IV SOLUTION) ×2 IMPLANT
KIT RM TURNOVER CYSTO AR (KITS) ×3 IMPLANT
NEEDLE INSUFFLATION 14GA 120MM (NEEDLE) ×3 IMPLANT
NS IRRIG 500ML POUR BTL (IV SOLUTION) ×3 IMPLANT
PACK LAPAROSCOPY BASIN (CUSTOM PROCEDURE TRAY) ×3 IMPLANT
PAD OB MATERNITY 4.3X12.25 (PERSONAL CARE ITEMS) ×3 IMPLANT
PAD PREP 24X48 CUFFED NSTRL (MISCELLANEOUS) ×3 IMPLANT
POUCH SPECIMEN RETRIEVAL 10MM (ENDOMECHANICALS) IMPLANT
SCISSORS LAP 5X45 EPIX DISP (ENDOMECHANICALS) ×3 IMPLANT
SEALER TISSUE G2 CVD JAW 45CM (ENDOMECHANICALS) IMPLANT
SET IRRIG TUBING LAPAROSCOPIC (IRRIGATION / IRRIGATOR) ×3 IMPLANT
SET IRRIG Y TYPE TUR BLADDER L (SET/KITS/TRAYS/PACK) IMPLANT
SUT VIC AB 3-0 PS2 18 (SUTURE) ×2
SUT VIC AB 3-0 PS2 18XBRD (SUTURE) ×1 IMPLANT
SUT VICRYL 0 ENDOLOOP (SUTURE) IMPLANT
SUT VICRYL 0 UR6 27IN ABS (SUTURE) IMPLANT
SUT VICRYL 4-0 PS2 18IN ABS (SUTURE) ×3 IMPLANT
TOWEL OR 17X24 6PK STRL BLUE (TOWEL DISPOSABLE) ×6 IMPLANT
TROCAR BALLN 12MMX100 BLUNT (TROCAR) IMPLANT
TROCAR OPTI TIP 5M 100M (ENDOMECHANICALS) ×3 IMPLANT
TROCAR XCEL DIL TIP R 11M (ENDOMECHANICALS) IMPLANT
TUBING INSUF HEATED (TUBING) ×3 IMPLANT
WARMER LAPAROSCOPE (MISCELLANEOUS) ×3 IMPLANT
WATER STERILE IRR 500ML POUR (IV SOLUTION) IMPLANT

## 2017-08-11 NOTE — Op Note (Signed)
[name]:  [name], [name]             ACCOUNT NO.:  [id number]  MEDICAL RECORD NO.:  [id number]  LOCATION:                                 FACILITY:  PHYSICIAN:  Juluis MireJohn S. Kieren Ricci, M.D.        DATE OF BIRTH:  DATE OF PROCEDURE:  08/11/2017 DATE OF DISCHARGE:                              OPERATIVE REPORT   PREOPERATIVE DIAGNOSES:  Pelvic pain, dyspareunia, possible bilateral endometriomas.  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  SURGEON:  Juluis MireJohn S. Bricen Victory, M.D.  ANESTHESIA:  General endotracheal.  ESTIMATED BLOOD LOSS:  Minimal.  PACKS AND DRAINS:  None.  INTRAOPERATIVE BLOOD PLACED:  None.  COMPLICATIONS:  None.  INDICATION:  Dictated in history and physical.  PROCEDURE IN DETAIL:  The patient was taken to the OR, placed in supine position.  After satisfactory level of general endotracheal anesthesia was obtained, the patient was placed in dorsal supine position using Allen stirrups.  The perineum was prepped out with Betadine along with the vagina.  Bladder was emptied by in-and-out catheterization.  A Hulka tenaculum was put in place and secured.  The abdomen was prepped with DuraPrep.  The patient was then draped in sterile field.  Subumbilical incision was made with a knife.  The Veress needle was introduced in the abdominal cavity.  The abdomen was inflated with approximately 3.5 L of carbon dioxide.  A 10/11 trocar was then inserted.  Laparoscope was introduced.  There was no evidence of injury to adjacent organs.  A 5-mm trocar was put in place in the suprapubic area.  Visualization revealed the uterus to be normal size and shape.  Appendix was normal.  Upper abdomen including liver, tip of the gallbladder were clear.  The uterus was then elevated.  Both ovaries were adherent to the pelvic sidewall. There was evidence of cul-de-sac endometriosis.  There was no cul-de-sac obliteration.  She had  bilateral endometriomas.  We approached then into the right side.  The right ovary was dissected free from its pelvic wall attachments.  The endometrium was identified and entered.  It did drain. This drainage was irrigated out.  Using the bipolar, the endometrioma lining was cauterized.  We then went to the left ovary.  It was elevated and adhesions were freed from the left pelvic sidewall.  The endometrium was identified and entered.  It drained a large amount of chocolate-like material.  This was irrigated out.  The cyst line was cauterized using the bipolar.  Areas of oozing from the pelvic sidewall were then cauterized.  Looks like, we had good hemostasis at this point in time. We thoroughly irrigated the pelvis.  There was no active bleeding.  Both endometriomas were identified, drained.  Lining was cauterized.  No active endometriosis was noted.  At this point in time, the abdomen was deflated with carbon dioxide.  All trocars were removed.  Subumbilical incision was closed with interrupted subcuticulars of 4-0 Vicryl.  Suprapubic was closed with Dermabond.  The Hulka tenaculum was then removed.  The patient was taken out of dorsal lithotomy position.  Once alert and extubated, transferred to recovery room in good condition.  Sponge,  instrument, and needle count were correct by circulating nurse.     Juluis MireJohn S. Rubye Strohmeyer, M.D.     JSM/MEDQ  D:  08/11/2017  T:  08/11/2017  Job:  [id number]

## 2017-08-11 NOTE — Anesthesia Postprocedure Evaluation (Signed)
Anesthesia Post Note  Patient: [name]  Procedure(s) Performed: LAPAROSCOPY DIAGNOSTIC (N/A )     Patient location during evaluation: PACU Anesthesia Type: General Level of consciousness: awake and alert Pain management: pain level controlled Vital Signs Assessment: post-procedure vital signs reviewed and stable Respiratory status: spontaneous breathing, nonlabored ventilation, respiratory function stable and patient connected to nasal cannula oxygen Cardiovascular status: blood pressure returned to baseline and stable Postop Assessment: no apparent nausea or vomiting Anesthetic complications: no    Last Vitals:  Vitals:   08/11/17 0915 08/11/17 0930  BP: 116/68 114/65  Pulse: 70 69  Resp: 14 13  Temp:    SpO2:      Last Pain:  Vitals:   08/11/17 0945  TempSrc:   PainSc: 0-No pain                 Shelton SilvasKevin D Akansha Wyche

## 2017-08-11 NOTE — Discharge Instructions (Signed)
DISCHARGE INSTRUCTIONS: Laparoscopy  The following instructions have been prepared to help you care for yourself upon your return home today.  Wound care:  Do not get the incision wet for the first 24 hours. The incision should be kept clean and dry.  The Band-Aids or dressings may be removed the day after surgery.  Should the incision become sore, red, and swollen after the first week, check with your doctor.  Personal hygiene:  Shower the day after your procedure.  Activity and limitations:  Do NOT drive or operate any equipment today.  Do NOT lift anything more than 15 pounds for 2-3 weeks after surgery.  Do NOT rest in bed all day.  Walking is encouraged. Walk each day, starting slowly with 5-minute walks 3 or 4 times a day. Slowly increase the length of your walks.  Walk up and down stairs slowly.  Do NOT do strenuous activities, such as golfing, playing tennis, bowling, running, biking, weight lifting, gardening, mowing, or vacuuming for 2-4 weeks. Ask your doctor when it is okay to start.  Diet: Eat a light meal as desired this evening. You may resume your usual diet tomorrow.  Return to work: This is dependent on the type of work you do. For the most part you can return to a desk job within a week of surgery. If you are more active at work, please discuss this with your doctor.  What to expect after your surgery: You may have a slight burning sensation when you urinate on the first day. You may have a very small amount of blood in the urine. Expect to have a small amount of vaginal discharge/light bleeding for 1-2 weeks. It is not unusual to have abdominal soreness and bruising for up to 2 weeks. You may be tired and need more rest for about 1 week. You may experience shoulder pain for 24-72 hours. Lying flat in bed may relieve it.  Call your doctor for any of the following:  Develop a fever of 100.4 or greater  Inability to urinate 6 hours after discharge from  hospital  Severe pain not relieved by pain medications  Persistent of heavy bleeding at incision site  Redness or swelling around incision site after a week  Increasing nausea or vomiting  Do not take any nonsteroidal anti inflammatories ( Ibuprofen, Advil, Aleve, Motrin) until after 2:30 pm today.   Post Anesthesia Home Care Instructions  Activity: Get plenty of rest for the remainder of the day. A responsible individual must stay with you for 24 hours following the procedure.  For the next 24 hours, DO NOT: -Drive a car -Advertising copywriterperate machinery -Drink alcoholic beverages -Take any medication unless instructed by your physician -Make any legal decisions or sign important papers.  Meals: Start with liquid foods such as gelatin or soup. Progress to regular foods as tolerated. Avoid greasy, spicy, heavy foods. If nausea and/or vomiting occur, drink only clear liquids until the nausea and/or vomiting subsides. Call your physician if vomiting continues.  Special Instructions/Symptoms: Your throat may feel dry or sore from the anesthesia or the breathing tube placed in your throat during surgery. If this causes discomfort, gargle with warm salt water. The discomfort should disappear within 24 hours.  If you had a scopolamine patch placed behind your ear for the management of post- operative nausea and/or vomiting:  1. The medication in the patch is effective for 72 hours, after which it should be removed.  Wrap patch in a tissue and discard in  the trash. Wash hands thoroughly with soap and water. 2. You may remove the patch earlier than 72 hours if you experience unpleasant side effects which may include dry mouth, dizziness or visual disturbances. 3. Avoid touching the patch. Wash your hands with soap and water after contact with the patch.

## 2017-08-11 NOTE — Op Note (Signed)
Patient name [name], [name] DICTATION#  [id number] CSN# [id number]  [facility]OM[facility],Nabil Bubolz S, MD 08/11/2017 8:28 AM

## 2017-08-11 NOTE — H&P (Signed)
  History and physical exam unchanged 

## 2017-08-11 NOTE — Transfer of Care (Signed)
Immediate Anesthesia Transfer of Care Note  Patient: [name]  Procedure(s) Performed: LAPAROSCOPY DIAGNOSTIC (N/A )  Patient Location: PACU  Anesthesia Type:General  Level of Consciousness: awake, alert , oriented and patient cooperative  Airway & Oxygen Therapy: Patient Spontanous Breathing and Patient connected to nasal cannula oxygen  Post-op Assessment: Report given to RN and Post -op Vital signs reviewed and stable  Post vital signs: Reviewed and stable  Last Vitals:  Vitals:   08/11/17 0542  BP: 139/85  Pulse: 92  Resp: 16  Temp: 36.6 C  SpO2: 100%    Last Pain:  Vitals:   08/11/17 0542  TempSrc: Oral      Patients Stated Pain Goal: 5 (08/11/17 [id number])  Complications: No apparent anesthesia complications

## 2017-08-11 NOTE — Anesthesia Procedure Notes (Addendum)
Procedure Name: Intubation Date/Time: 08/11/2017 7:32 AM Performed by: Wanita Chamberlain, CRNA Pre-anesthesia Checklist: Patient identified, Timeout performed, Emergency Drugs available, Suction available and Patient being monitored Patient Re-evaluated:Patient Re-evaluated prior to induction Oxygen Delivery Method: Circle system utilized Preoxygenation: Pre-oxygenation with 100% oxygen Induction Type: IV induction Ventilation: Mask ventilation without difficulty Laryngoscope Size: Mac and 3 Grade View: Grade I Tube type: Oral Tube size: 7.0 mm Number of attempts: 1 Airway Equipment and Method: Stylet Placement Confirmation: breath sounds checked- equal and bilateral,  positive ETCO2 and ETT inserted through vocal cords under direct vision Secured at: 21 cm Tube secured with: Tape Dental Injury: Teeth and Oropharynx as per pre-operative assessment

## 2017-08-11 NOTE — Anesthesia Preprocedure Evaluation (Addendum)
Anesthesia Evaluation  Patient identified by MRN, date of birth, ID band Patient awake    Reviewed: Allergy & Precautions, NPO status , Patient's Chart, lab work & pertinent test results  Airway Mallampati: I  TM Distance: >3 FB Neck ROM: Full    Dental  (+) Teeth Intact, Dental Advisory Given   Pulmonary neg pulmonary ROS,    breath sounds clear to auscultation       Cardiovascular negative cardio ROS   Rhythm:Regular Rate:Normal     Neuro/Psych  Headaches, negative neurological ROS  negative psych ROS   GI/Hepatic Neg liver ROS, GERD  Controlled and Medicated,  Endo/Other  negative endocrine ROS  Renal/GU negative Renal ROS     Musculoskeletal negative musculoskeletal ROS (+)   Abdominal (+) + obese,   Peds  Hematology negative hematology ROS (+)   Anesthesia Other Findings Day of surgery medications reviewed with the patient.  Reproductive/Obstetrics                           Anesthesia Physical Anesthesia Plan  ASA: III  Anesthesia Plan: General   Post-op Pain Management:    Induction: Intravenous  PONV Risk Score and Plan: 4 or greater and Ondansetron, Dexamethasone, Scopolamine patch - Pre-op and Midazolam  Airway Management Planned: Oral ETT  Additional Equipment:   Intra-op Plan:   Post-operative Plan: Extubation in OR  Informed Consent: I have reviewed the patients History and Physical, chart, labs and discussed the procedure including the risks, benefits and alternatives for the proposed anesthesia with the patient or authorized representative who has indicated his/her understanding and acceptance.   Dental advisory given  Plan Discussed with: CRNA  Anesthesia Plan Comments:         Anesthesia Quick Evaluation

## 2017-08-12 ENCOUNTER — Encounter (HOSPITAL_BASED_OUTPATIENT_CLINIC_OR_DEPARTMENT_OTHER): Payer: Self-pay | Admitting: Obstetrics and Gynecology

## 2017-10-06 NOTE — Addendum Note (Signed)
Addendum  created 10/06/17 0836 by Lance CoonWebster, [place], CRNA   Intraprocedure Meds edited

## 2017-11-10 ENCOUNTER — Encounter: Payer: Self-pay | Admitting: Family Medicine

## 2017-11-10 ENCOUNTER — Ambulatory Visit: Payer: BC Managed Care – PPO | Admitting: Family Medicine

## 2017-11-10 VITALS — BP 128/72 | HR 87 | Temp 98.3°F | Ht 65.0 in | Wt 237.0 lb

## 2017-11-10 DIAGNOSIS — J301 Allergic rhinitis due to pollen: Secondary | ICD-10-CM

## 2017-11-10 NOTE — Patient Instructions (Signed)
Zyrtec 10 mg plain..........Marland Kitchen. 1 at bedtime  Nasal irrigation with a Nettie pot as follows,,,,,,,,,,, 1/8 of a teaspoon of salt and 1/8 of a teaspoon of baking soda,,,,,,,,,, warm water,,,,,,,, irrigate each nostril at bedtime  Then one shot of Afrin nasal spray,,,,,,,,,,, tilting head back over the patella for 10 minutes,,,,,,,, then one shot of steroid nasal spray  After 5 nights stop the Afrin but continue the saline steroid nasal spray until clear,,,,,,,, once you are clear then stop the saline nasal irrigation but continue one shot of the steroid nasal spray up each nostril at bedtime year round

## 2017-11-10 NOTE — Progress Notes (Signed)
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  Couple weeks ago she had a flare. She's noticed more head congestion and popping ears.  Review of systems otherwise negative.vs BP 128/72 (BP Location: Left Arm, Patient Position: Sitting, Cuff Size: Large)   Pulse 87   Temp 98.3 F (36.8 C) (Oral)   Ht 5\' 5"  (1.651 m)   Wt 237 lb (107.5 kg)   SpO2 98%   BMI 39.44 kg/m  Well-developed well-nourished female no acute distress HEENT were negative except for marked bilateral nasal edema neck was supple no adenopathy  Allergic rhinitis,,,,,,,,,,, outlined program........ antihistamine plain...Marland Kitchen.Marland Kitchen.Marland Kitchen. steroid nasal. Spray........ nasal irrigation with a Nettie pot

## 2017-11-25 ENCOUNTER — Encounter: Payer: BC Managed Care – PPO | Admitting: Family Medicine

## 2017-11-28 ENCOUNTER — Encounter: Payer: Self-pay | Admitting: Family Medicine

## 2017-11-28 ENCOUNTER — Ambulatory Visit (INDEPENDENT_AMBULATORY_CARE_PROVIDER_SITE_OTHER): Payer: BC Managed Care – PPO | Admitting: Family Medicine

## 2017-11-28 VITALS — BP 102/80 | HR 98 | Temp 98.5°F | Ht 65.75 in | Wt 229.0 lb

## 2017-11-28 DIAGNOSIS — Z0000 Encounter for general adult medical examination without abnormal findings: Secondary | ICD-10-CM

## 2017-11-28 DIAGNOSIS — Z111 Encounter for screening for respiratory tuberculosis: Secondary | ICD-10-CM

## 2017-11-28 DIAGNOSIS — Z1331 Encounter for screening for depression: Secondary | ICD-10-CM | POA: Diagnosis not present

## 2017-11-28 LAB — HEPATIC FUNCTION PANEL
ALBUMIN: 3.8 g/dL (ref 3.5–5.2)
ALK PHOS: 72 U/L (ref 39–117)
ALT: 26 U/L (ref 0–35)
AST: 17 U/L (ref 0–37)
Bilirubin, Direct: 0.1 mg/dL (ref 0.0–0.3)
Total Bilirubin: 0.4 mg/dL (ref 0.2–1.2)
Total Protein: 7.8 g/dL (ref 6.0–8.3)

## 2017-11-28 LAB — LIPID PANEL
CHOLESTEROL: 117 mg/dL (ref 0–200)
HDL: 37.5 mg/dL — AB (ref >39.00)
LDL Cholesterol: 61 mg/dL (ref 0–99)
NonHDL: 79.39
Total CHOL/HDL Ratio: 3
Triglycerides: 90 mg/dL (ref 0.0–149.0)
VLDL: 18 mg/dL (ref 0.0–40.0)

## 2017-11-28 LAB — BASIC METABOLIC PANEL
BUN: 14 mg/dL (ref 6–23)
CALCIUM: 9.5 mg/dL (ref 8.4–10.5)
CHLORIDE: 106 meq/L (ref 96–112)
CO2: 24 mEq/L (ref 19–32)
CREATININE: 0.67 mg/dL (ref 0.40–1.20)
GFR: 103.1 mL/min (ref >60.00)
GLUCOSE: 98 mg/dL (ref 70–99)
Potassium: 4.2 mEq/L (ref 3.5–5.1)
Sodium: 139 mEq/L (ref 135–145)

## 2017-11-28 LAB — CBC WITH DIFFERENTIAL/PLATELET
BASOS PCT: 0.5 % (ref 0.0–3.0)
Basophils Absolute: 0 10*3/uL (ref 0.0–0.1)
EOS ABS: 0.3 10*3/uL (ref 0.0–0.7)
EOS PCT: 3.3 % (ref 0.0–5.0)
HCT: 43.7 % (ref 36.0–46.0)
Hemoglobin: 15.4 g/dL — ABNORMAL HIGH (ref 12.0–15.0)
LYMPHS ABS: 2.7 10*3/uL (ref 0.7–4.0)
Lymphocytes Relative: 33.3 % (ref 12.0–46.0)
MCHC: 35.2 g/dL (ref 30.0–36.0)
MCV: 89.2 fl (ref 78.0–100.0)
MONO ABS: 0.8 10*3/uL (ref 0.1–1.0)
Monocytes Relative: 9.4 % (ref 3.0–12.0)
NEUTROS PCT: 53.5 % (ref 43.0–77.0)
Neutro Abs: 4.3 10*3/uL (ref 1.4–7.7)
PLATELETS: 291 10*3/uL (ref 150.0–400.0)
RBC: 4.9 Mil/uL (ref 3.87–5.11)
RDW: 12.4 % (ref 11.5–15.5)
WBC: 8 10*3/uL (ref 4.0–10.5)

## 2017-11-28 LAB — TSH: TSH: 2.23 u[IU]/mL (ref 0.35–4.50)

## 2017-11-28 LAB — POC URINALSYSI DIPSTICK (AUTOMATED)
Bilirubin, UA: NEGATIVE
GLUCOSE UA: NEGATIVE
KETONES UA: NEGATIVE
Nitrite, UA: NEGATIVE
Protein, UA: NEGATIVE
UROBILINOGEN UA: 0.2 U/dL
pH, UA: 6 (ref 5.0–8.0)

## 2017-11-28 NOTE — Progress Notes (Signed)
   Subjective:    Patient ID: [name], female    DOB: 02/11/1976, 42 y.o.   MRN: [id number]  HPI Here for a well exam. She feels great.    Review of Systems  Constitutional: Negative.   HENT: Negative.   Eyes: Negative.   Respiratory: Negative.   Cardiovascular: Negative.   Gastrointestinal: Negative.   Genitourinary: Negative for decreased urine volume, difficulty urinating, dyspareunia, dysuria, enuresis, flank pain, frequency, hematuria, pelvic pain and urgency.  Musculoskeletal: Negative.   Skin: Negative.   Neurological: Negative.   Psychiatric/Behavioral: Negative.        Objective:   Physical Exam  Constitutional: She is oriented to person, place, and time. She appears well-developed and well-nourished. No distress.  HENT:  Head: Normocephalic and atraumatic.  Right Ear: External ear normal.  Left Ear: External ear normal.  Nose: Nose normal.  Mouth/Throat: Oropharynx is clear and moist. No oropharyngeal exudate.  Eyes: Conjunctivae and EOM are normal. Pupils are equal, round, and reactive to light. No scleral icterus.  Neck: Normal range of motion. Neck supple. No JVD present. No thyromegaly present.  Cardiovascular: Normal rate, regular rhythm, normal heart sounds and intact distal pulses. Exam reveals no gallop and no friction rub.  No murmur heard. Pulmonary/Chest: Effort normal and breath sounds normal. No respiratory distress. She has no wheezes. She has no rales. She exhibits no tenderness.  Abdominal: Soft. Bowel sounds are normal. She exhibits no distension and no mass. There is no tenderness. There is no rebound and no guarding.  Musculoskeletal: Normal range of motion. She exhibits no edema or tenderness.  Lymphadenopathy:    She has no cervical adenopathy.  Neurological: She is alert and oriented to person, place, and time. She has normal reflexes. No cranial nerve deficit. She exhibits normal muscle tone. Coordination normal.  Skin: Skin is warm  and dry. No rash noted. No erythema.  Psychiatric: She has a normal mood and affect. Her behavior is normal. Judgment and thought content normal.          Assessment & Plan:  Well exam. We discussed diet and exercise. Get fasting labs. Gershon CraneStephen Fry, MD

## 2017-12-01 LAB — TB SKIN TEST
Induration: 0 mm
TB Skin Test: NEGATIVE

## 2018-03-02 ENCOUNTER — Other Ambulatory Visit: Payer: Self-pay | Admitting: Obstetrics and Gynecology

## 2018-03-02 DIAGNOSIS — R928 Other abnormal and inconclusive findings on diagnostic imaging of breast: Secondary | ICD-10-CM

## 2018-03-06 ENCOUNTER — Ambulatory Visit
Admission: RE | Admit: 2018-03-06 | Discharge: 2018-03-06 | Disposition: A | Discharge status: Home / Self Care | Payer: BC Managed Care – PPO | Source: Ambulatory Visit | Attending: Obstetrics and Gynecology | Admitting: Obstetrics and Gynecology

## 2018-03-06 DIAGNOSIS — R928 Other abnormal and inconclusive findings on diagnostic imaging of breast: Secondary | ICD-10-CM

## 2018-10-12 ENCOUNTER — Ambulatory Visit: Payer: Self-pay

## 2018-10-12 ENCOUNTER — Encounter: Payer: Self-pay | Admitting: Family Medicine

## 2018-10-12 ENCOUNTER — Ambulatory Visit: Payer: BC Managed Care – PPO | Admitting: Family Medicine

## 2018-10-12 VITALS — BP 110/70 | HR 92 | Temp 98.5°F | Wt 233.0 lb

## 2018-10-12 DIAGNOSIS — R42 Dizziness and giddiness: Secondary | ICD-10-CM

## 2018-10-12 MED ORDER — METHYLPREDNISOLONE ACETATE 80 MG/ML IJ SUSP
120.0000 mg | Freq: Once | INTRAMUSCULAR | Status: AC
Start: 2018-10-12 — End: 2018-10-12
  Administered 2018-10-12: 120 mg via INTRAMUSCULAR

## 2018-10-12 MED ORDER — MECLIZINE HCL 25 MG PO TABS: 25.0000 mg | ORAL_TABLET | ORAL | 0 refills | Status: DC | PRN

## 2018-10-12 NOTE — Addendum Note (Signed)
Addended by: Marcellus Scott on: 10/12/2018 03:41 PM   Modules accepted: Orders

## 2018-10-12 NOTE — Telephone Encounter (Signed)
C/o onset of feeling off balance, and "swimmy headed", at intervals, since early Saturday AM.  Reported the "symptoms are centered around lying down, or getting up from laying position."  Stated the episodes last only a few seconds, unless she remains in laying position.  With laying position, the swimmy headed feeling comes in waves.  Stated when she gets up, initially she will feel off balance, then after taking several steps, the symptom subsides.  C/o intermittent dull headache.  C/o nausea since onset of the swimmy headed feeling.  Reported blurry vision in right eye with contacts in, for a couple months.  Reported she is at work, and feels fine; denied feeling any vertigo-type symptoms at this time.  Appt. Sched. At 2:30 PM with PCP.  Advised to call back if symptoms worsen.  Verb. Understanding.                Reason for Disposition . [1] MODERATE dizziness (e.g., vertigo; feels very unsteady, interferes with normal activities) AND [2] has NOT been evaluated by physician for this  Answer Assessment - Initial Assessment Questions 1. DESCRIPTION: "Describe your dizziness."     Room feels like it is swimmy; like a "fun house mirror"  2. VERTIGO: "Do you feel like either you or the room is spinning or tilting?"      No  3. LIGHTHEADED: "Do you feel lightheaded?" (e.g., somewhat faint, woozy, weak upon standing)    Denied; sx's only last for a few seconds; centered around lying down or getting up from laying position; comes in waves when laying  4. SEVERITY: "How bad is it?"  "Can you walk?"   - MILD - Feels unsteady but walking normally.   - MODERATE - Feels very unsteady when walking, but not falling; interferes with normal activities (e.g., school, work) .   - SEVERE - Unable to walk without falling (requires assistance).     Moderate; comes and goes quickly 5. ONSET:  "When did the dizziness begin?"     Saturday early AM 6. AGGRAVATING FACTORS: "Does anything make it worse?" (e.g.,  standing, change in head position)     With laying in bed, nothing makes it better or worse 7. CAUSE: "What do you think is causing the dizziness?"     unknown 8. RECURRENT SYMPTOM: "Have you had dizziness before?" If so, ask: "When was the last time?" "What happened that time?"     Never experienced  9. OTHER SYMPTOMS: "Do you have any other symptoms?" (e.g., headache, weakness, numbness, vomiting, earache)     Nausea, feeling off balance, right eye is blurry with contact lens, past couple of months, occas. dull headache; had some sinus congestion approx. One mo. Ago; still has some post nasal drip; on Zyrtec    10. PREGNANCY: "Is there any chance you are pregnant?" "When was your last menstrual period?"       on birth control; LMP approx. 4 weeks ago  Protocols used: DIZZINESS - VERTIGO-A-AH

## 2018-10-12 NOTE — Progress Notes (Signed)
   Subjective:    Patient ID: [name], female    DOB: 1976-09-26, 43 y.o.   MRN: [id number]  HPI Here for 2 days of dizziness which occurs when she lies down or gets up from lying down. This lasts about 5 minutes and then lessens. She still feels a little unbalanced most of the time and she has mild nausea. Her allergies have been acting up for several weeks with sinus congestion and PND. No headache or fever. She takes Zyrtec daily.    Review of Systems  Constitutional: Negative.   HENT: Positive for congestion, postnasal drip and sinus pressure. Negative for sinus pain and sore throat.   Eyes: Negative.   Respiratory: Negative.   Cardiovascular: Negative.   Neurological: Positive for dizziness. Negative for headaches.       Objective:   Physical Exam Constitutional:      General: She is not in acute distress.    Appearance: Normal appearance.  HENT:     Right Ear: Tympanic membrane and ear canal normal.     Left Ear: Tympanic membrane and ear canal normal.     Nose: Nose normal.     Mouth/Throat:     Pharynx: Oropharynx is clear.  Eyes:     Extraocular Movements: Extraocular movements intact.     Conjunctiva/sclera: Conjunctivae normal.     Pupils: Pupils are equal, round, and reactive to light.  Cardiovascular:     Rate and Rhythm: Normal rate and regular rhythm.     Pulses: Normal pulses.     Heart sounds: Normal heart sounds.  Pulmonary:     Effort: Pulmonary effort is normal.     Breath sounds: Normal breath sounds.  Lymphadenopathy:     Cervical: No cervical adenopathy.  Neurological:     General: No focal deficit present.     Mental Status: She is alert and oriented to person, place, and time.     Cranial Nerves: No cranial nerve deficit.     Coordination: Coordination normal.           Assessment & Plan:  Vertigo. Given a steroid shot. Use Zyrtec D for a week or two. Add meclizine prn.  Gershon Crane, MD

## 2018-10-27 ENCOUNTER — Telehealth: Payer: Self-pay | Admitting: *Deleted

## 2018-10-27 NOTE — Telephone Encounter (Signed)
Have her see me again to discuss our options

## 2018-10-27 NOTE — Telephone Encounter (Signed)
Dr. Fry please advise. Thanks  

## 2018-10-27 NOTE — Telephone Encounter (Signed)
Called and spoke with pt and she is aware of appt scheduled with Dr. Clent Ridges for tomorrow at [phone]

## 2018-10-27 NOTE — Telephone Encounter (Signed)
Per last OV 10/12/2018 - vertigo was addressed.   Assessment & Plan:  Vertigo. Given a steroid shot. Use Zyrtec D for a week or two. Add meclizine prn.  Gershon Crane, MD   Copied from CRM [id number]. Topic: General - Other >> Oct 27, 2018  8:44 AM Percival Spanish wrote:  Pt said her vertigo is still the same and none of the medicine has made a difference and is asking what the next step would be

## 2018-10-28 ENCOUNTER — Ambulatory Visit: Payer: BC Managed Care – PPO | Admitting: Family Medicine

## 2018-10-28 ENCOUNTER — Encounter: Payer: Self-pay | Admitting: Family Medicine

## 2018-10-28 VITALS — BP 100/68 | HR 89 | Temp 98.7°F | Wt 230.1 lb

## 2018-10-28 DIAGNOSIS — H8110 Benign paroxysmal vertigo, unspecified ear: Secondary | ICD-10-CM

## 2018-10-28 NOTE — Progress Notes (Signed)
   Subjective:    Patient ID: [name], female    DOB: 1976/06/17, 43 y.o.   MRN: [id number]  HPI Here to follow up on dizziness which is consistent with BPV. This started about 3 weeks ago, and she describes typical bouts of dizziness when she moves her head quickly, when she gets up out of bed, and when she turns over a certain way in bed. She had some headaches at first but not now. She tried taking Zyrtec D and Meclizine for a week or so but these did not help, so she stopped taking them.    Review of Systems  Constitutional: Negative.   Respiratory: Negative.   Cardiovascular: Negative.   Neurological: Positive for dizziness. Negative for headaches.       Objective:   Physical Exam Constitutional:      Appearance: Normal appearance.  HENT:     Right Ear: Tympanic membrane and ear canal normal.     Left Ear: Tympanic membrane and ear canal normal.     Nose: Nose normal.     Mouth/Throat:     Pharynx: Oropharynx is clear.  Eyes:     Extraocular Movements: Extraocular movements intact.     Conjunctiva/sclera: Conjunctivae normal.     Pupils: Pupils are equal, round, and reactive to light.  Neck:     Musculoskeletal: Normal range of motion.  Cardiovascular:     Rate and Rhythm: Normal rate and regular rhythm.     Pulses: Normal pulses.     Heart sounds: Normal heart sounds.  Pulmonary:     Effort: Pulmonary effort is normal.     Breath sounds: Normal breath sounds.  Neurological:     General: No focal deficit present.     Mental Status: She is alert and oriented to person, place, and time.     Cranial Nerves: No cranial nerve deficit.     Coordination: Coordination normal.           Assessment & Plan:  Vertigo. We will refer her to Vestibular Rehab.  Gershon Crane, MD

## 2018-11-23 ENCOUNTER — Other Ambulatory Visit: Payer: Self-pay

## 2018-11-23 ENCOUNTER — Encounter: Payer: Self-pay | Admitting: Physical Therapy

## 2018-11-23 ENCOUNTER — Ambulatory Visit: Payer: BC Managed Care – PPO | Attending: Family Medicine | Admitting: Physical Therapy

## 2018-11-23 DIAGNOSIS — R42 Dizziness and giddiness: Secondary | ICD-10-CM

## 2018-11-23 DIAGNOSIS — R2681 Unsteadiness on feet: Secondary | ICD-10-CM | POA: Diagnosis present

## 2018-11-23 DIAGNOSIS — R29818 Other symptoms and signs involving the nervous system: Secondary | ICD-10-CM | POA: Diagnosis present

## 2018-11-23 NOTE — Therapy (Signed)
[facility] [address] [place], [place], [address] Phone: [phone]   Fax:  [phone]  Physical Therapy Evaluation  Patient Details  Name: [name] MRN: [id number] Date of Birth: 05/22/1976 Referring Provider (PT):  Nelwyn Salisbury, MD   Encounter Date: 11/23/2018  PT End of Session - 11/23/18 1659    Visit Number  1    Number of Visits  4    Date for PT Re-Evaluation  01/22/19    Authorization Type  [employer]; 30% coinsurance; $1500 deductible; VL=[place]    PT Start Time  0755    PT Stop Time  0845    PT Time Calculation (min)  50 min    Activity Tolerance  Patient tolerated treatment well    Behavior During Therapy  [facility] for tasks assessed/performed       Past Medical History:  Diagnosis Date  . History of gestational hypertension 2008  . Pelvic pain in female   . Psoriasis    followed by dr Elmon Else---   . Seasonal allergies   . Sinus infection    08-01-2017 started antibiotic  . Wears contact lenses     Past Surgical History:  Procedure Laterality Date  . CESAREAN SECTION  06-02-2007   dr Arelia Sneddon  [facility]  . DILATION AND EVACUATION  12-16-2005    dr Arelia Sneddon  [facility]  . IM NAILING FEMORAL SHAFT FRACTURE Left 2001  . LAPAROSCOPY N/A 08/11/2017   Procedure: LAPAROSCOPY DIAGNOSTIC;  Surgeon: Richardean Chimera, MD;  Location: [facility];  Service: Gynecology;  Laterality: N/A;    There were no vitals filed for this visit.   Subjective Assessment - 11/23/18 0759    Subjective  It (vertigo) was extremely positional 7 weeks ago and now it's like a shadow of itself. Like it's almost going to happen. I still get nauseated.     Pertinent History  non-contributory    Patient Stated Goals  to stop feeling nauseated and dizzy "off"    Currently in Pain?  No/denies         [facility] PT Assessment - 11/23/18 0803      Assessment   Medical Diagnosis   BPPV    Referring Provider (PT)   Nelwyn Salisbury, MD    Onset Date/Surgical Date  10/07/18    Prior Therapy  none      Precautions   Precautions  Fall      Balance Screen   Has the patient fallen in the past 6 months  No      Prior Function   Vocation  Full time employment    Vocation Requirements  elementary school     Leisure  mom      Cognition   Overall Cognitive Status  Within Functional Limits for tasks assessed      Observation/Other Assessments   Focus on Therapeutic Outcomes (FOTO)   FOTO=97 (risk adjusted 74); DHI 18 (>16 indicates mild handicap)      Ambulation/Gait   Ambulation Distance (Feet)  60 Feet   75   Assistive device  None    Gait Pattern  Within Functional Limits    Gait Comments  no significant impairments noted; no imbalance           Vestibular Assessment - 11/23/18 0001      Vestibular Assessment   General Observation  Has not thrown up and just feels nausea come and go  Symptom Behavior   Type of Dizziness  "Funny feeling in head"    Frequency of Dizziness  daily    Duration of Dizziness  seconds    Aggravating Factors  Supine to sit;Rolling to left    Relieving Factors  Head stationary      Occulomotor Exam   Occulomotor Alignment  Normal      Vestibulo-Occular Reflex   Comment  HIT + Left      Visual Acuity   Static  8    Dynamic  5   >2 line difference indicative of peripheral hypofunction     Positional Testing   Dix-Hallpike  Dix-Hallpike Right;Dix-Hallpike Left    Horizontal Canal Testing  Horizontal Canal Right;Horizontal Canal Left      Dix-Hallpike Right   Dix-Hallpike Right Duration  0    Dix-Hallpike Right Symptoms  No nystagmus      Dix-Hallpike Left   Dix-Hallpike Left Duration  0    Dix-Hallpike Left Symptoms  No nystagmus      Horizontal Canal Right   Horizontal Canal Right Duration  0    Horizontal Canal Right Symptoms  Normal      Horizontal Canal Left   Horizontal Canal Left Duration  0    Horizontal Canal Left Symptoms  Normal           Objective measurements completed on examination: See above findings.       Vestibular Treatment/Exercise - 11/23/18 1657      Vestibular Treatment/Exercise   Vestibular Treatment Provided  Gaze    Gaze Exercises  X1 Viewing Horizontal;X1 Viewing Vertical      X1 Viewing Horizontal   Foot Position  apart > hip width    Time  --   15-25 seconds   Reps  3    Comments  increased time between reps for symptoms to subside with explanation of correct techniques      X1 Viewing Vertical   Foot Position  apart > hip width    Time  --   20-25 sec   Reps  3            PT Education - 11/23/18 1658    Education Details  role of PT; negative for BPPV; +HIT and dynamic vs static visual acuity indicative of left peripheral hypofunction; HEP     Person(s) Educated  Patient    Methods  Explanation;Demonstration;Verbal cues;Handout    Comprehension  Verbalized understanding;Returned demonstration;Verbal cues required;Need further instruction       PT Short Term Goals - 11/23/18 1712      PT SHORT TERM GOAL #1   Title  Patient verbalizes proper progression of VORx1 exercise for increasing difficulty as appropriate. (Target all STGs 12/23/2018)    Time  4    Period  Weeks    Status  New    Target Date  12/23/18      PT SHORT TERM GOAL #2   Title  Patient will verbalize understanding of how to obtain PT referral for vestibular rehab should she have recurrence of BPPV    Time  4    Period  Weeks    Status  New        PT Long Term Goals - 11/23/18 1714      PT LONG TERM GOAL #1   Title  Patient reports no nausea or vertigo with common daily activities including walking down store aisles while turning head (Target all LTGs 01/22/2019)    Time  8    Period  Weeks    Status  New    Target Date  01/22/19      PT LONG TERM GOAL #2   Title  DHI score <16 indicative of no significant impairment due to dizziness.     Time  8    Period  Weeks    Status  New              Plan - 11/23/18 1701    Clinical Impression Statement  Patient with sudden onset of vertigo when awakened from sleep ~7 weeks ago. Saw MD twice, and on 10/28/18 he referred patient for vestibular rehab. Due to high census, pt just now being evaluated by PT. Patient's initial symptoms sound consistent with BPPV, however her symptoms have cleared significantly and positional testing was negative for BPPV. 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 Patient educated on results of evaluation and initial HEP. Pt also provide next steps to increase challenge of HEP. Patient may require further PT to advance HEP to minimize patient's vestibular symptoms (nausea and dizziness). Educated she may improve enougth with initial HEP that she does not have further significant symptoms and she may call and cancel futurre appointments if not needed.     History and Personal Factors relevant to plan of care:  PMH-non-contributory; Personal factors: +young age    Clinical Presentation  Evolving    Clinical Presentation due to:  despite dizziness/feeling "off" the vertigo has resolved and she does not exhibit balance impairments with basic mobility    Rehab Potential  Excellent    PT Frequency  Biweekly    PT Duration  8 weeks    PT Treatment/Interventions  ADLs/Self Care Home Management;Canalith Repostioning;Functional mobility training;Therapeutic activities;Therapeutic exercise;Balance training;Patient/family education;Neuromuscular re-education;Vestibular;Visual/perceptual remediation/compensation    PT Next Visit Plan  How far has she progressed VORx1 exercise? (feet together? incr distance to target? busy background?) Current movements that trigger (? use for habituation); try on foam EC; update HEP as approp    Consulted and Agree with Plan of Care  Patient       Patient will benefit from skilled therapeutic intervention in order to improve the following  deficits and impairments:  Decreased activity tolerance, Decreased knowledge of precautions, Decreased mobility, Dizziness, Impaired vision/preception  Visit Diagnosis: Dizziness and giddiness - Plan: PT plan of care cert/re-cert  Unsteadiness on feet - Plan: PT plan of care cert/re-cert  Other symptoms and signs involving the nervous system - Plan: PT plan of care cert/re-cert     Problem List Patient Active Problem List   Diagnosis Date Noted  . Allergic rhinitis due to pollen 11/10/2017  . FEVER BLISTER 11/14/2008  . GERD 11/11/2007  . PSORIASIS 11/11/2007  . HEADACHE 11/11/2007    Zena Amos, PT 11/23/2018, 5:37 PM  [facility] [address] [place], [place], [address] Phone: [phone]   Fax:  [phone]  Name: JERZEE DANZY MRN: [id number] Date of Birth: 1976/04/26

## 2018-11-23 NOTE — Patient Instructions (Signed)
Gaze Stabilization: Tip Card  1.Target must remain in focus, not blurry, and appear stationary while head is in motion. 2.Perform exercises with small head movements (30 to either side of midline). 3.Increase speed of head motion so long as target is in focus. 4.If you wear eyeglasses, be sure you can see target through lens (therapist will give specific instructions for bifocal / progressive lenses). 5.These exercises should provoke dizziness or nausea. Work through these symptoms. If too dizzy, slow head movement slightly. (Too dizzy is moderately dizzy or 5 out of 10 with a 10 meaning you're so dizzy you are falling).  Overall the symptoms of dizziness or nausea should resolve within 30 minutes of completing all repetitions. 6.Exercises demand concentration; avoid distractions. Begin with the target on a simple background unless instructed otherwise.      Special Instructions:  If symptoms are lasting longer than 30 minutes, modify your exercises by:    >decreasing the # of times you complete each activity >ensuring your symptoms return to baseline before moving onto the next exercise >dividing up exercises so you do not do them all in one session, but multiple short sessions throughout the day >doing them once a day until symptoms improve    For safety, perform standing exercises close to a counter, wall, corner, or next to someone.   Gaze Stabilization - Standing Feet Apart   Feet shoulder width apart, keeping eyes on target on wall 3 feet away, tilt head down slightly and move head side to side for _30-45-60___ seconds. Rest and repeat side to side 2 more times. Then repeat while moving head up and down for __30-45-60__ seconds. Rest and repeat up and down two more times. (ie do each direction three times)  Do __2-3__ sessions per day.

## 2018-12-08 ENCOUNTER — Ambulatory Visit: Payer: BC Managed Care – PPO | Admitting: Physical Therapy

## 2018-12-22 ENCOUNTER — Ambulatory Visit: Payer: BC Managed Care – PPO | Admitting: Physical Therapy

## 2018-12-22 ENCOUNTER — Other Ambulatory Visit: Payer: Self-pay

## 2018-12-22 ENCOUNTER — Encounter: Payer: Self-pay | Admitting: Family Medicine

## 2018-12-22 ENCOUNTER — Ambulatory Visit (INDEPENDENT_AMBULATORY_CARE_PROVIDER_SITE_OTHER): Payer: BC Managed Care – PPO | Admitting: Family Medicine

## 2018-12-22 ENCOUNTER — Telehealth: Payer: Self-pay | Admitting: Physical Therapy

## 2018-12-22 DIAGNOSIS — J029 Acute pharyngitis, unspecified: Secondary | ICD-10-CM | POA: Diagnosis not present

## 2018-12-22 MED ORDER — CEPHALEXIN 500 MG PO CAPS: 500.0000 mg | ORAL_CAPSULE | Freq: Three times a day (TID) | ORAL | 0 refills | Status: AC

## 2018-12-22 NOTE — Progress Notes (Signed)
   Subjective:    Patient ID: [name], female    DOB: 1975-12-14, 43 y.o.   MRN: [id number]  HPI Virtual Visit via Video Note  I connected this patient on 12/22/18 at  8:45 AM EDT by a video enabled telemedicine application and verified that I am speaking with the correct person using two identifiers.  Location patient: home Location provider:work or home office Persons participating in the virtual visit: patient, provider  I discussed the limitations of evaluation and management by telemedicine and the availability of in person appointments. The patient expressed understanding and agreed to proceed.   HPI: 3 days ago she developed a bad ST and chills, though there has been no measurable fever. Now she also has tender swollen nodes in the neck beneath the jaws. Ibuprofen helps a little. Of note her daughter had a probable strep throat infection last week and her husband was treated for impetigo last week. No cough or SOB or GI symptoms.    ROS: See pertinent positives and negatives per HPI.  Past Medical History:  Diagnosis Date  . History of gestational hypertension 2008  . Pelvic pain in female   . Psoriasis    followed by dr Elmon Else---   . Seasonal allergies   . Sinus infection    08-01-2017 started antibiotic  . Wears contact lenses        Current Outpatient Medications:  .  Adalimumab (HUMIRA PEN [place]), Inject 1 pen into the skin. Every other week , Disp: , Rfl:  .  cetirizine (ZYRTEC) 10 MG tablet, Take 10 mg at bedtime by mouth., Disp: , Rfl:  .  meclizine (ANTIVERT) 25 MG tablet, Take 1 tablet (25 mg total) by mouth every 4 (four) hours as needed for dizziness. (Patient not taking: Reported on 10/28/2018), Disp: 60 tablet, Rfl: 0 .  Norethin Ace-Eth Estrad-FE (TAYTULLA) 1-20 MG-MCG(24) CAPS, Take 1 tablet every evening by mouth., Disp: , Rfl:   EXAM:  VITALS per patient if applicable:  GENERAL: alert, oriented, appears well and in no acute distress   HEENT: atraumatic, conjunttiva clear, no obvious abnormalities on inspection of external nose and ears  NECK: normal movements of the head and neck  LUNGS: on inspection no signs of respiratory distress, breathing rate appears normal, no obvious gross SOB, gasping or wheezing  CV: no obvious cyanosis  MS: moves all visible extremities without noticeable abnormality  PSYCH/NEURO: pleasant and cooperative, no obvious depression or anxiety, speech and thought processing grossly intact  ASSESSMENT AND PLAN:  Discussed the following assessment and plan: She likely has a strep throat infection, so we will treat this with Keflex. Recheck prn.  No diagnosis found.     I discussed the assessment and treatment plan with the patient. The patient was provided an opportunity to ask questions and all were answered. The patient agreed with the plan and demonstrated an understanding of the instructions.   The patient was advised to call back or seek an in-person evaluation if the symptoms worsen or if the condition fails to improve as anticipated.  I provided 15 minutes of non-face-to-face time during this encounter.    Review of Systems     Objective:   Physical Exam        Assessment & Plan:

## 2018-12-22 NOTE — Telephone Encounter (Signed)
[name] was contacted today regarding the temporary closing of [facility] due to Covid-19.  Therapist discussed:   1) pt has improved with her HEP and continues to do the exercises daily. She is no longer having short bouts of vertigo.  2) She wants to keep her last scheduled appointment (4/14) in case a need arises.    Patient is not interested in further information for an e-visit, virtual check in, or telehealth visit, if those services become available.    [facility] will follow up with patients when we are able to resume care.  Veda Canning, PT [facility] [address] [place], [place]  [address] Phone:  [phone] Fax:  [phone] \

## 2019-01-12 ENCOUNTER — Encounter: Payer: BC Managed Care – PPO | Admitting: Physical Therapy

## 2019-09-08 IMAGING — MG DIGITAL DIAGNOSTIC UNILATERAL RIGHT MAMMOGRAM WITH TOMO AND CAD
4 series · 4 of 12 positions shown · non-contrast
Comparison: Mammography 02/25/2018, 02/17/2017.

CLINICAL DATA: Recall from screening mammography with
tomosynthesis, possible architectural distortion involving the OUTER
RIGHT breast at POSTERIOR depth.

EXAM:
DIGITAL DIAGNOSTIC RIGHT MAMMOGRAM WITH TOMO
ULTRASOUND RIGHT BREAST

[R MLO synth-2D]
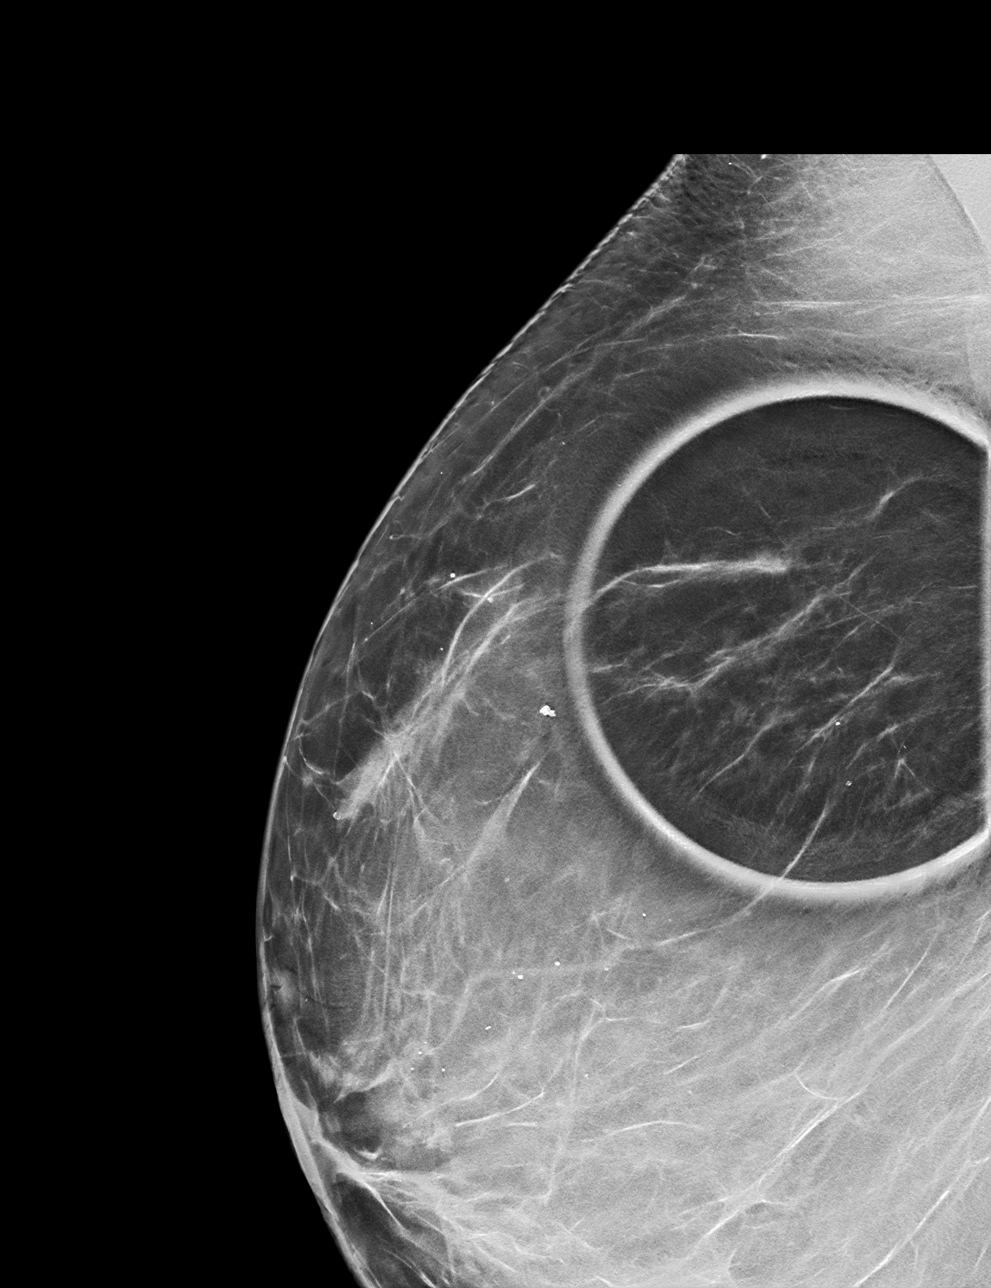

[R CC synth-2D]
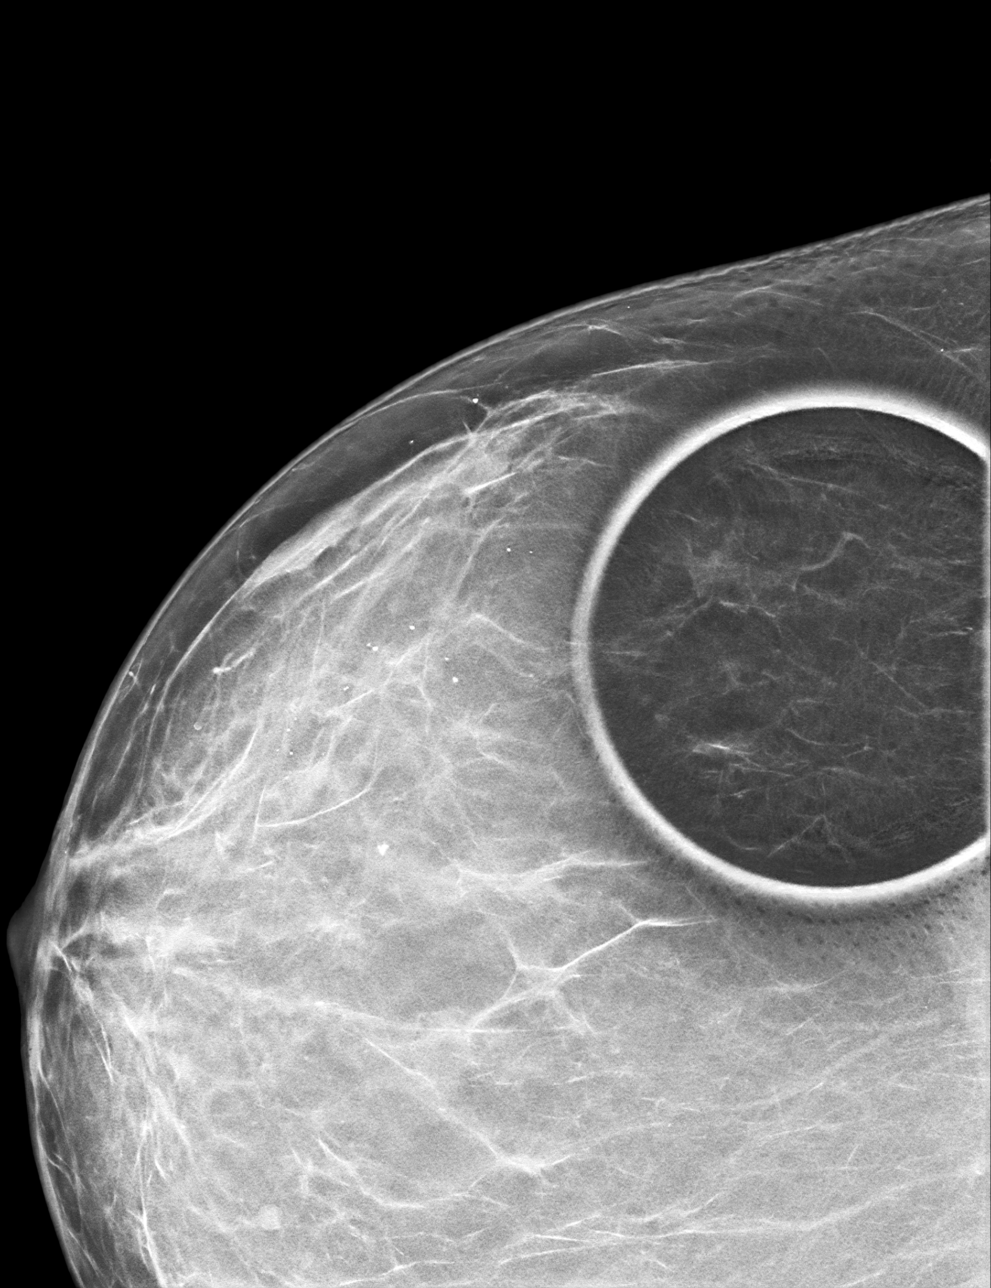

[R MLO tomo · tomo slice 37/72.0]
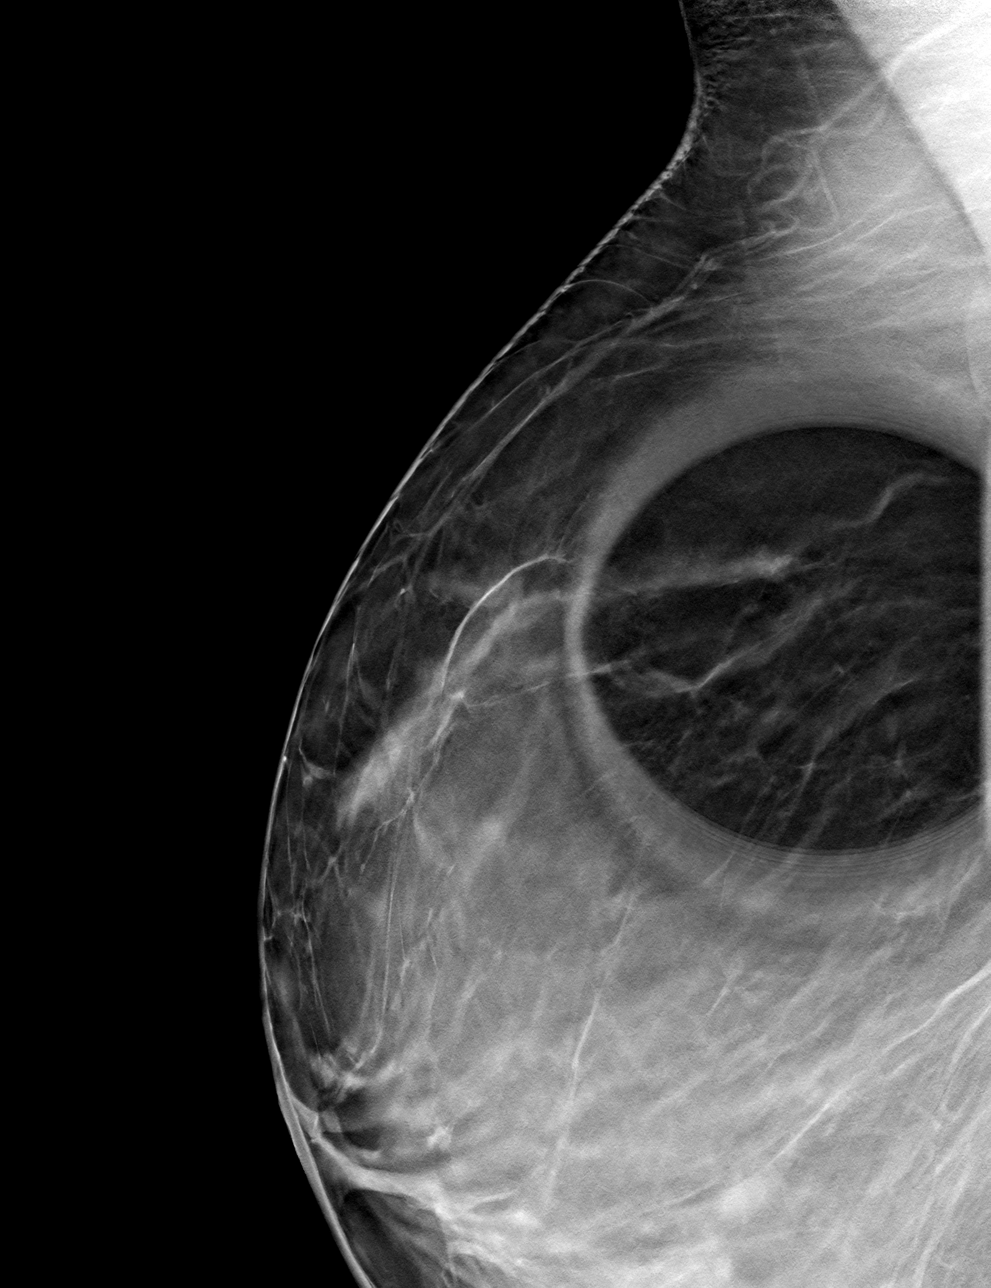

[R CC tomo · tomo slice 33/64.0]
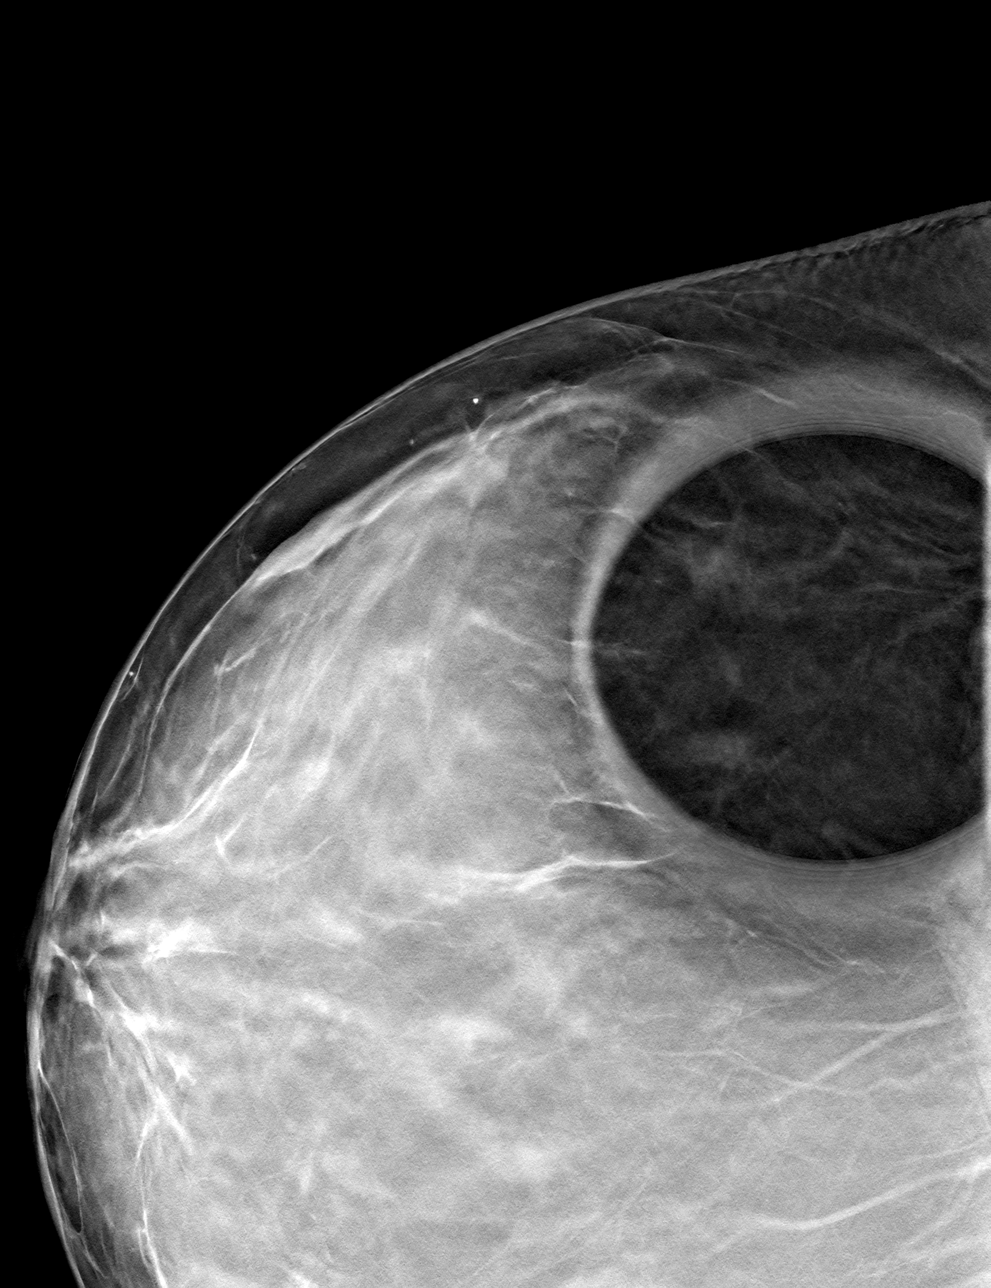

[4 of 12 positions shown; findings below may reference images not displayed]

No prior
ultrasound.

ACR Breast Density Category b: There are scattered areas of
fibroglandular density.
FINDINGS: Tomosynthesis and synthesized spot-compression CC and MLO views of
the area of concern in the RIGHT breast were obtained.

No persistent architectural distortion in the area of concern in the
OUTER RIGHT breast at POSTERIOR depth on the spot compression views.
There is no underlying mass or abnormal calcifications. An isolated
island of glandular tissue is present in this location.

On physical exam, there is no palpable abnormality in the OUTER
RIGHT breast.

Targeted RIGHT breast ultrasound is performed, showing an island of
normal fibroglandular tissue in the OUTER breast at POSTERIOR depth
at the 9:30 o'clock location approximately 13 cm from the nipple,
corresponding to the area of concern on screening mammography. No
cyst, solid mass, abnormal acoustic shadowing or architectural
distortion is identified.

Incidental note is made of a hyperechoic focus involving a
subcutaneous fat lobule at the 9 o'clock position approximately 9 cm
from the nipple measuring approximately 4 x 4 x 10 mm.
IMPRESSION: 1. No mammographic or sonographic evidence of malignancy involving
the RIGHT breast. Specifically, no persistent architectural
distortion in the OUTER RIGHT breast as questioned on screening
mammography.
2. Incidental benign fat necrosis involving the OUTER RIGHT breast
at the 9 o'clock position approximately 9 cm from nipple.

RECOMMENDATION:
Screening mammogram in one year.(Code:RU-F-UNE)

I have discussed the findings and recommendations with the patient.
Results were also provided in writing at the conclusion of the
visit. If applicable, a reminder letter will be sent to the patient
regarding the next appointment.

BI-RADS CATEGORY  2: Benign.

## 2019-09-08 IMAGING — US ULTRASOUND RIGHT BREAST LIMITED
1 series · 7 of 7 positions shown · non-contrast
Comparison: Mammography 02/25/2018, 02/17/2017.

CLINICAL DATA: Recall from screening mammography with
tomosynthesis, possible architectural distortion involving the OUTER
RIGHT breast at POSTERIOR depth.

EXAM:
DIGITAL DIAGNOSTIC RIGHT MAMMOGRAM WITH TOMO
ULTRASOUND RIGHT BREAST

[Series 1: ultrasound right breast limited · 0.07mm/px · 7 of 7 slices shown]
[im 1/7]
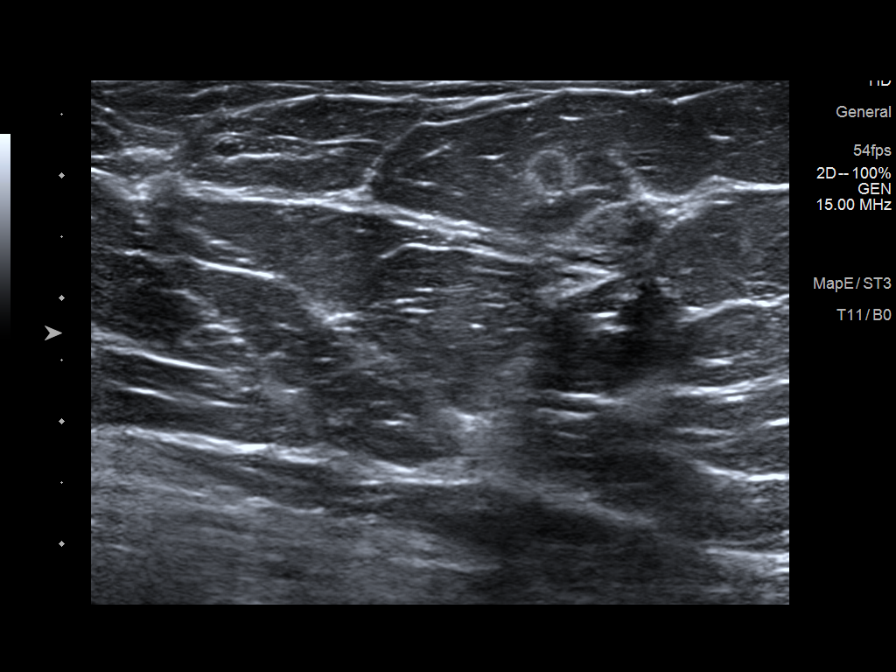
[im 2/7]
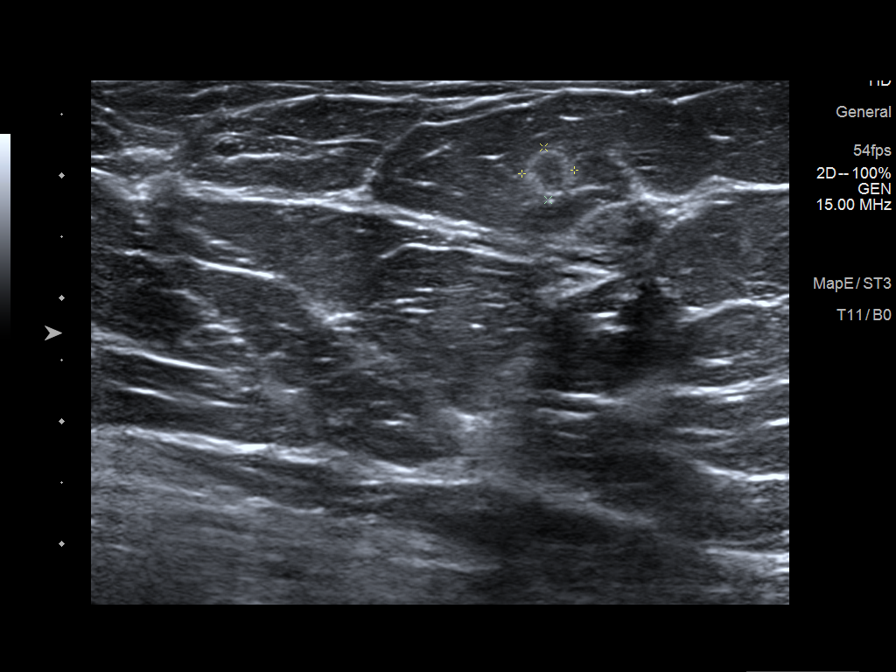
[im 3/7]
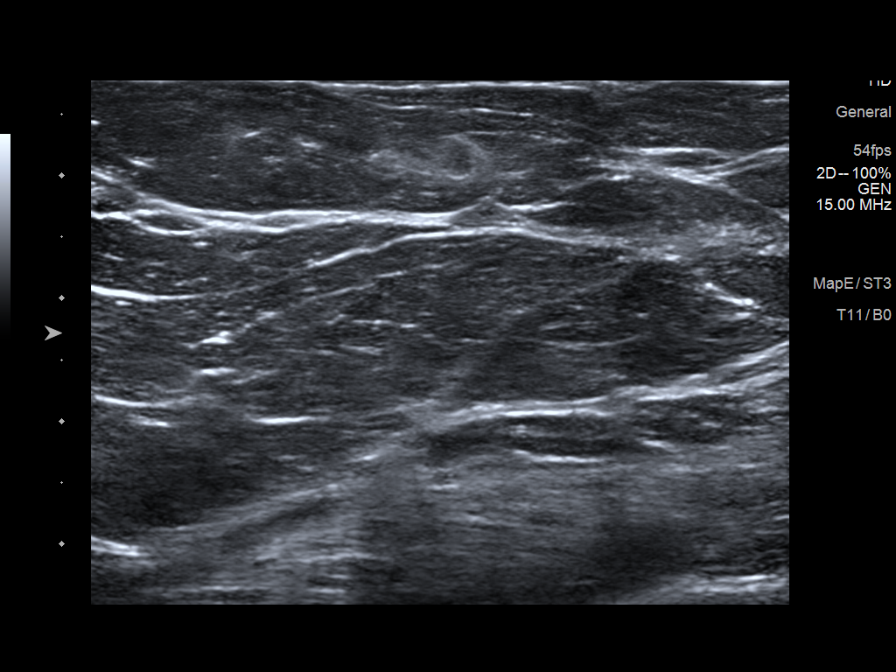
[im 4/7]
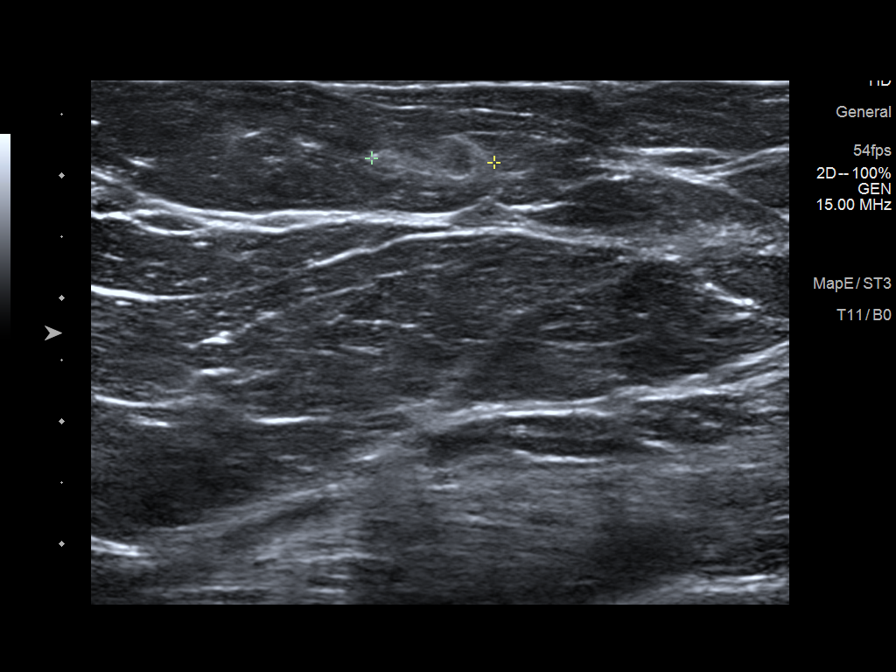
[im 5/7]
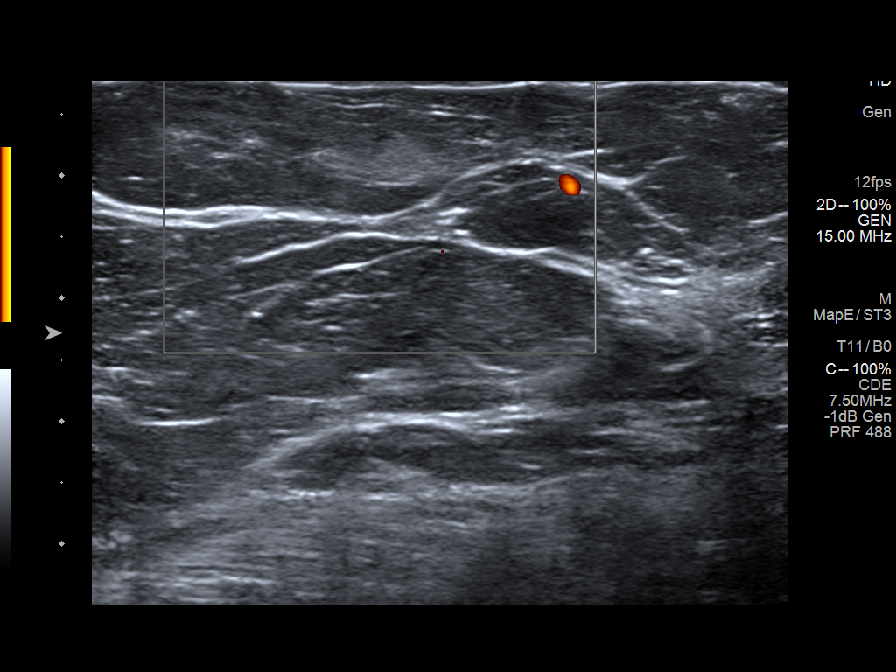
[im 6/7]
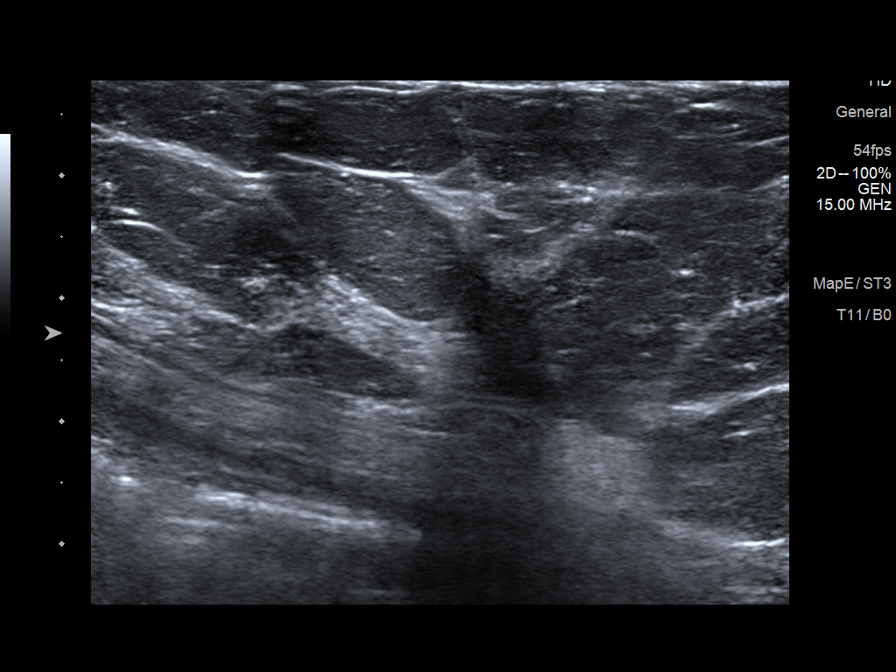
[im 7/7]
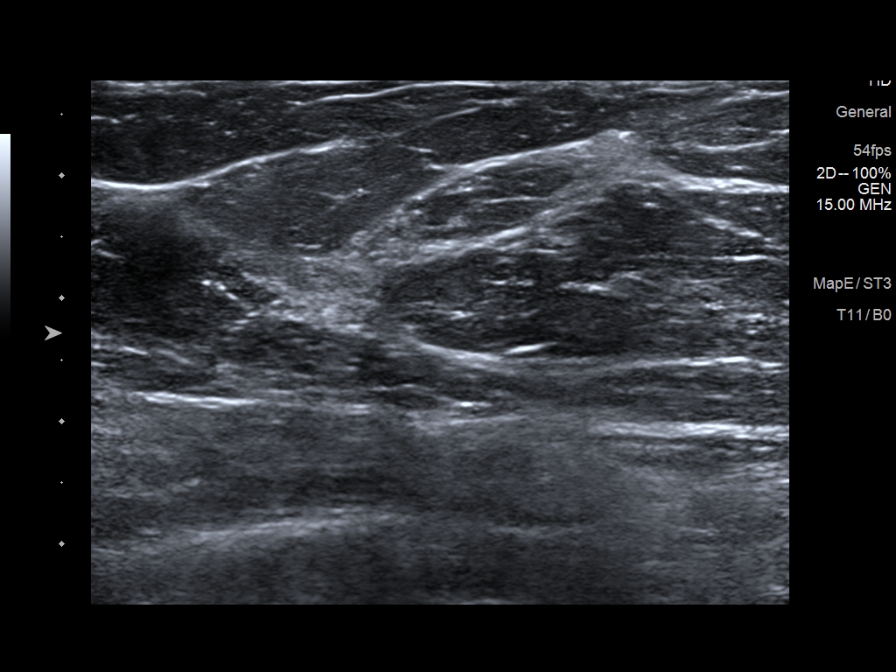

[7 of 7 positions shown; findings below may reference images not displayed]

No prior
ultrasound.

ACR Breast Density Category b: There are scattered areas of
fibroglandular density.
FINDINGS: Tomosynthesis and synthesized spot-compression CC and MLO views of
the area of concern in the RIGHT breast were obtained.

No persistent architectural distortion in the area of concern in the
OUTER RIGHT breast at POSTERIOR depth on the spot compression views.
There is no underlying mass or abnormal calcifications. An isolated
island of glandular tissue is present in this location.

On physical exam, there is no palpable abnormality in the OUTER
RIGHT breast.

Targeted RIGHT breast ultrasound is performed, showing an island of
normal fibroglandular tissue in the OUTER breast at POSTERIOR depth
at the 9:30 o'clock location approximately 13 cm from the nipple,
corresponding to the area of concern on screening mammography. No
cyst, solid mass, abnormal acoustic shadowing or architectural
distortion is identified.

Incidental note is made of a hyperechoic focus involving a
subcutaneous fat lobule at the 9 o'clock position approximately 9 cm
from the nipple measuring approximately 4 x 4 x 10 mm.
IMPRESSION: 1. No mammographic or sonographic evidence of malignancy involving
the RIGHT breast. Specifically, no persistent architectural
distortion in the OUTER RIGHT breast as questioned on screening
mammography.
2. Incidental benign fat necrosis involving the OUTER RIGHT breast
at the 9 o'clock position approximately 9 cm from nipple.

RECOMMENDATION:
Screening mammogram in one year.(Code:RU-F-UNE)

I have discussed the findings and recommendations with the patient.
Results were also provided in writing at the conclusion of the
visit. If applicable, a reminder letter will be sent to the patient
regarding the next appointment.

BI-RADS CATEGORY  2: Benign.

## 2020-04-13 ENCOUNTER — Other Ambulatory Visit: Payer: Self-pay

## 2020-04-13 ENCOUNTER — Ambulatory Visit (INDEPENDENT_AMBULATORY_CARE_PROVIDER_SITE_OTHER): Payer: BC Managed Care – PPO | Admitting: Internal Medicine

## 2020-04-13 ENCOUNTER — Encounter: Payer: Self-pay | Admitting: Internal Medicine

## 2020-04-13 VITALS — Ht 65.5 in | Wt 220.0 lb

## 2020-04-13 DIAGNOSIS — J309 Allergic rhinitis, unspecified: Secondary | ICD-10-CM

## 2020-04-13 DIAGNOSIS — J0141 Acute recurrent pansinusitis: Secondary | ICD-10-CM | POA: Diagnosis not present

## 2020-04-13 MED ORDER — AMOXICILLIN-POT CLAVULANATE 875-125 MG PO TABS: 1.0000 | ORAL_TABLET | Freq: Two times a day (BID) | ORAL | 0 refills | Status: DC

## 2020-04-13 NOTE — Progress Notes (Signed)
Virtual Visit via Video Note  I connected with@ on 04/13/20 at  9:30 AM EDT by a video enabled telemedicine application and verified that I am speaking with the correct person using two identifiers. Location patient: work [employer] home office Persons participating in the virtual visit: patient, provider  [place] national recommendations  regarding COVID 19 pandemic   video visit is advised over in office visit for this patient.  Patient aware  of the limitations of evaluation and management by telemedicine and  availability of in person appointments. and agreed to proceed.   HPI: [name] presents for video visit for a week sof sx getting worse onset with  hoarseness ur congestion nd drainage  And now sinus presusre and tenderness  Maxillary  Like when she gets sinus infections. Has presumed allergy from seasonla issues  Has ben having sneezing fits  Using mucinex d and saline and recent netti pot with congestion  And had 2 days of afrin at night vefore irrigation .  mucous is cloudy yellow but not real thick yellow  Taking her clariin flonase saline etc  Family not ill no fever cough more related to drainage and not otherwise ill . 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  ROS: See pertinent positives and negatives per HPI.  Past Medical History:  Diagnosis Date  . History of gestational hypertension 2008  . Pelvic pain in female   . Psoriasis    followed by dr Elmon Else---   . Seasonal allergies   . Sinus infection    08-01-2017 started antibiotic  . Wears contact lenses     Past Surgical History:  Procedure Laterality Date  . CESAREAN SECTION  06-02-2007   dr Arelia Sneddon  [facility]  . DILATION AND EVACUATION  12-16-2005    dr Arelia Sneddon  [facility]  . IM NAILING FEMORAL SHAFT FRACTURE Left 2001  . LAPAROSCOPY N/A 08/11/2017   Procedure: LAPAROSCOPY DIAGNOSTIC;  Surgeon: Richardean Chimera, MD;  Location:  [facility];  Service: Gynecology;  Laterality: N/A;    History reviewed. No pertinent family history.  Social History   Tobacco Use  . Smoking status: Never Smoker  . Smokeless tobacco: Never Used  Vaping Use  . Vaping Use: Never used  Substance Use Topics  . Alcohol use: Yes    Comment: rare  . Drug use: No      Current Outpatient Medications:  .  Adalimumab (HUMIRA PEN [place]), Inject 1 pen into the skin. Every other week , Disp: , Rfl:  .  cetirizine (ZYRTEC) 10 MG tablet, Take 10 mg at bedtime by mouth., Disp: , Rfl:  .  desonide (DESOWEN) 0.05 % cream, Apply topically 2 (two) times daily., Disp: , Rfl:  .  guaiFENesin (MUCINEX) 600 MG 12 hr tablet, Take by mouth 2 (two) times daily., Disp: , Rfl:  .  Norethin Ace-Eth Estrad-FE (TAYTULLA) 1-20 MG-MCG(24) CAPS, Take 1 tablet every evening by mouth., Disp: , Rfl:  .  amoxicillin-clavulanate (AUGMENTIN) 875-125 MG tablet, Take 1 tablet by mouth every 12 (twelve) hours. For sinusitis, Disp: 14 tablet, Rfl: 0 .  meclizine (ANTIVERT) 25 MG tablet, Take 1 tablet (25 mg total) by mouth every 4 (four) hours as needed for dizziness. (Patient not taking: Reported on 10/28/2018), Disp: 60 tablet, Rfl: 0  EXAM: BP Readings from Last 3 Encounters:  10/28/18 100/68  10/12/18 110/70  11/28/17 102/80    VITALS per patient if applicable:  GENERAL: alert, oriented, appears well and in no acute distress modestly hoarse  But no stridor or cough and looks well but very congestion nasally   HEENT: atraumatic, conjunttiva clear, no obvious abnormalities on inspection of external nose and ears  NECK: normal movements of the head and neck  LUNGS: on inspection no signs of respiratory distress, breathing rate appears normal, no obvious gross SOB, gasping or wheezing  CV: no obvious cyanosis  PSYCH/NEURO: pleasant and cooperative, no obvious depression or anxiety, speech and thought processing grossly intact   ASSESSMENT AND  PLAN:  Discussed the following assessment and plan:    ICD-10-CM   1. Acute recurrent pansinusitis  J01.41   2. Allergic sinusitis  J30.9    Has done maximal  Sinus hygiene and getting worse  Add antibiotic offered pred thinks not needed but will increase the flonase to bid  consider seeing allergist if getting frequent sinusitis with allergy sx   To help define avoidance measures etc  Counseled.   Expectant management and discussion of plan and treatment with opportunity to ask questions and all were answered. The patient agreed with the plan and demonstrated an understanding of the instructions.   Advised to call back or seek an in-person evaluation if worsening  or having  further concerns . Return if symptoms worsen or fail to improve as expected.   Berniece Andreas, MD

## 2021-10-02 ENCOUNTER — Encounter: Payer: BC Managed Care – PPO | Admitting: Family Medicine

## 2021-10-03 ENCOUNTER — Telehealth (INDEPENDENT_AMBULATORY_CARE_PROVIDER_SITE_OTHER): Payer: BC Managed Care – PPO | Admitting: Adult Health

## 2021-10-03 ENCOUNTER — Other Ambulatory Visit: Payer: Self-pay

## 2021-10-03 ENCOUNTER — Encounter: Payer: Self-pay | Admitting: Adult Health

## 2021-10-03 VITALS — Ht 65.5 in | Wt 220.0 lb

## 2021-10-03 DIAGNOSIS — R058 Other specified cough: Secondary | ICD-10-CM | POA: Diagnosis not present

## 2021-10-03 MED ORDER — BENZONATATE 200 MG PO CAPS: 200.0000 mg | ORAL_CAPSULE | Freq: Three times a day (TID) | ORAL | 1 refills | Status: DC | PRN

## 2021-10-03 NOTE — Progress Notes (Signed)
Virtual Visit via Video Note  I connected with [name] on 10/03/21 at  3:30 PM EST by a video enabled telemedicine application and verified that I am speaking with the correct person using two identifiers.  Location patient: home Location provider:work or home office Persons participating in the virtual visit: patient, provider  I discussed the limitations of evaluation and management by telemedicine and the availability of in person appointments. The patient expressed understanding and agreed to proceed.   HPI: 46 year old female who is being evaluated today for post-COVID cough.  She was diagnosed with COVID 19 via home test on 09/22/2021.  Symptoms have improved and resolved except for cough.  She reports that the cough is worse when laying down and with certain smells which triggered her to go into a coughing fit.  Cough is dry until the very end of the exacerbation when she will cough up some green mucus.  At home she has been using Delsym without relief.  He denies fevers, chills, shortness of breath, wheezing, sinus issues.   ROS: See pertinent positives and negatives per HPI.  Past Medical History:  Diagnosis Date   History of gestational hypertension 2008   Pelvic pain in female    Psoriasis    followed by dr Elmon Else---    Seasonal allergies    Sinus infection    08-01-2017 started antibiotic   Wears contact lenses     Past Surgical History:  Procedure Laterality Date   CESAREAN SECTION  06-02-2007   dr Arelia Sneddon  [facility]   DILATION AND EVACUATION  12-16-2005    dr Arelia Sneddon  [facility]   IM NAILING FEMORAL SHAFT FRACTURE Left 2001   LAPAROSCOPY N/A 08/11/2017   Procedure: LAPAROSCOPY DIAGNOSTIC;  Surgeon: Richardean Chimera, MD;  Location: [facility];  Service: Gynecology;  Laterality: N/A;    History reviewed. No pertinent family history.     Current Outpatient Medications:    Adalimumab (HUMIRA PEN [place]), Inject 1 pen into the skin. Every other week , Disp: ,  Rfl:    benzonatate (TESSALON) 200 MG capsule, Take 1 capsule (200 mg total) by mouth 3 (three) times daily as needed for cough., Disp: 30 capsule, Rfl: 1   cetirizine (ZYRTEC) 10 MG tablet, Take 10 mg at bedtime by mouth., Disp: , Rfl:    desonide (DESOWEN) 0.05 % cream, Apply topically 2 (two) times daily., Disp: , Rfl:    Norethin Ace-Eth Estrad-FE 1-20 MG-MCG(24) CAPS, Take 1 tablet every evening by mouth., Disp: , Rfl:   EXAM:  VITALS per patient if applicable:  GENERAL: alert, oriented, appears well and in no acute distress  HEENT: atraumatic, conjunttiva clear, no obvious abnormalities on inspection of external nose and ears  NECK: normal movements of the head and neck  LUNGS: on inspection no signs of respiratory distress, breathing rate appears normal, no obvious gross SOB, gasping or wheezing  CV: no obvious cyanosis  MS: moves all visible extremities without noticeable abnormality  PSYCH/NEURO: pleasant and cooperative, no obvious depression or anxiety, speech and thought processing grossly intact  ASSESSMENT AND PLAN:  Discussed the following assessment and plan:  1. Post-viral cough syndrome - benzonatate (TESSALON) 200 MG capsule; Take 1 capsule (200 mg total) by mouth 3 (three) times daily as needed for cough.  Dispense: 30 capsule; Refill: 1      I discussed the assessment and treatment plan with the patient. The patient was provided an opportunity to ask questions and all were answered. The  patient agreed with the plan and demonstrated an understanding of the instructions.   The patient was advised to call back or seek an in-person evaluation if the symptoms worsen or if the condition fails to improve as anticipated.   Shirline Frees, NP

## 2021-11-01 ENCOUNTER — Encounter: Payer: Self-pay | Admitting: Family Medicine

## 2021-11-01 ENCOUNTER — Ambulatory Visit (INDEPENDENT_AMBULATORY_CARE_PROVIDER_SITE_OTHER): Payer: BC Managed Care – PPO | Admitting: Family Medicine

## 2021-11-01 VITALS — BP 110/78 | HR 99 | Temp 98.5°F | Ht 65.5 in | Wt 228.0 lb

## 2021-11-01 DIAGNOSIS — L719 Rosacea, unspecified: Secondary | ICD-10-CM | POA: Diagnosis not present

## 2021-11-01 DIAGNOSIS — Z23 Encounter for immunization: Secondary | ICD-10-CM

## 2021-11-01 DIAGNOSIS — R058 Other specified cough: Secondary | ICD-10-CM

## 2021-11-01 DIAGNOSIS — Z0000 Encounter for general adult medical examination without abnormal findings: Secondary | ICD-10-CM

## 2021-11-01 DIAGNOSIS — Z209 Contact with and (suspected) exposure to unspecified communicable disease: Secondary | ICD-10-CM | POA: Diagnosis not present

## 2021-11-01 DIAGNOSIS — U071 COVID-19: Secondary | ICD-10-CM

## 2021-11-01 LAB — BASIC METABOLIC PANEL
BUN: 13 mg/dL (ref 6–23)
CO2: 26 mEq/L (ref 19–32)
Calcium: 9 mg/dL (ref 8.4–10.5)
Chloride: 104 mEq/L (ref 96–112)
Creatinine, Ser: 0.75 mg/dL (ref 0.40–1.20)
GFR: 96.49 mL/min (ref >60.00)
Glucose, Bld: 82 mg/dL (ref 70–99)
Potassium: 4 mEq/L (ref 3.5–5.1)
Sodium: 136 mEq/L (ref 135–145)

## 2021-11-01 LAB — LIPID PANEL
Cholesterol: 111 mg/dL (ref 0–200)
HDL: 39 mg/dL — ABNORMAL LOW (ref >39.00)
LDL Cholesterol: 51 mg/dL (ref 0–99)
NonHDL: 72.17
Total CHOL/HDL Ratio: 3
Triglycerides: 104 mg/dL (ref 0.0–149.0)
VLDL: 20.8 mg/dL (ref 0.0–40.0)

## 2021-11-01 LAB — HEPATIC FUNCTION PANEL
ALT: 39 U/L — ABNORMAL HIGH (ref 0–35)
AST: 30 U/L (ref 0–37)
Albumin: 4 g/dL (ref 3.5–5.2)
Alkaline Phosphatase: 69 U/L (ref 39–117)
Bilirubin, Direct: 0 mg/dL (ref 0.0–0.3)
Total Bilirubin: 0.4 mg/dL (ref 0.2–1.2)
Total Protein: 8.2 g/dL (ref 6.0–8.3)

## 2021-11-01 LAB — CBC WITH DIFFERENTIAL/PLATELET
Basophils Absolute: 0 10*3/uL (ref 0.0–0.1)
Basophils Relative: 0.5 % (ref 0.0–3.0)
Eosinophils Absolute: 0.1 10*3/uL (ref 0.0–0.7)
Eosinophils Relative: 2.9 % (ref 0.0–5.0)
HCT: 43.9 % (ref 36.0–46.0)
Hemoglobin: 14.9 g/dL (ref 12.0–15.0)
Lymphocytes Relative: 34.6 % (ref 12.0–46.0)
Lymphs Abs: 1.3 10*3/uL (ref 0.7–4.0)
MCHC: 33.8 g/dL (ref 30.0–36.0)
MCV: 90.8 fl (ref 78.0–100.0)
Monocytes Absolute: 0.7 10*3/uL (ref 0.1–1.0)
Monocytes Relative: 18.9 % — ABNORMAL HIGH (ref 3.0–12.0)
Neutro Abs: 1.6 10*3/uL (ref 1.4–7.7)
Neutrophils Relative %: 43.1 % (ref 43.0–77.0)
Platelets: 246 10*3/uL (ref 150.0–400.0)
RBC: 4.84 Mil/uL (ref 3.87–5.11)
RDW: 12.9 % (ref 11.5–15.5)
WBC: 3.7 10*3/uL — ABNORMAL LOW (ref 4.0–10.5)

## 2021-11-01 LAB — VITAMIN B12: Vitamin B-12: 297 pg/mL (ref 211–911)

## 2021-11-01 LAB — T3, FREE: T3, Free: 3.4 pg/mL (ref 2.3–4.2)

## 2021-11-01 LAB — HEMOGLOBIN A1C: Hgb A1c MFr Bld: 5.3 % (ref 4.6–6.5)

## 2021-11-01 LAB — TSH: TSH: 1.72 u[IU]/mL (ref 0.35–5.50)

## 2021-11-01 LAB — T4, FREE: Free T4: 0.69 ng/dL (ref 0.60–1.60)

## 2021-11-01 MED ORDER — BENZONATATE 200 MG PO CAPS: 200.0000 mg | ORAL_CAPSULE | Freq: Three times a day (TID) | ORAL | 5 refills | Status: DC | PRN

## 2021-11-01 NOTE — Addendum Note (Signed)
Addended by: Carola Rhine on: 11/01/2021 12:01 PM   Modules accepted: Orders

## 2021-11-01 NOTE — Progress Notes (Signed)
Subjective:    Patient ID: [name], female    DOB: 02/22/1976, 46 y.o.   MRN: [id number]  HPI Here for a well exam. She has had a rough several months, and she is still not over it apparently. She tested positive for the Covid-19 virus on 09-22-21, and she was fairly sick for about 2 weeks. Once this resolved, she still has a dry cough from time to time and she feels very tired and "not well". She still takes Humira, and Dr. Emily Filbert, her dermatologist, has asked that we check a Quantiferon gold test with her labs. Dr. Emily Filbert is also treating her for rosacea on the face.    Review of Systems  Constitutional:  Positive for fatigue.  HENT: Negative.    Eyes: Negative.   Respiratory:  Positive for cough.   Cardiovascular: Negative.   Gastrointestinal: Negative.   Genitourinary:  Negative for decreased urine volume, difficulty urinating, dyspareunia, dysuria, enuresis, flank pain, frequency, hematuria, pelvic pain and urgency.  Musculoskeletal: Negative.   Skin: Negative.   Neurological: Negative.  Negative for headaches.  Psychiatric/Behavioral: Negative.        Objective:   Physical Exam Constitutional:      General: She is not in acute distress.    Appearance: She is well-developed. She is obese.  HENT:     Head: Normocephalic and atraumatic.     Right Ear: External ear normal.     Left Ear: External ear normal.     Nose: Nose normal.     Mouth/Throat:     Pharynx: No oropharyngeal exudate.  Eyes:     General: No scleral icterus.    Conjunctiva/sclera: Conjunctivae normal.     Pupils: Pupils are equal, round, and reactive to light.  Neck:     Thyroid: No thyromegaly.     Vascular: No JVD.  Cardiovascular:     Rate and Rhythm: Normal rate and regular rhythm.     Heart sounds: Normal heart sounds. No murmur heard.   No friction rub. No gallop.  Pulmonary:     Effort: Pulmonary effort is normal. No respiratory distress.     Breath sounds: Normal breath sounds. No  wheezing or rales.  Chest:     Chest wall: No tenderness.  Abdominal:     General: Bowel sounds are normal. There is no distension.     Palpations: Abdomen is soft. There is no mass.     Tenderness: There is no abdominal tenderness. There is no guarding or rebound.  Musculoskeletal:        General: No tenderness. Normal range of motion.     Cervical back: Normal range of motion and neck supple.  Lymphadenopathy:     Cervical: No cervical adenopathy.  Skin:    General: Skin is warm and dry.     Findings: No erythema or rash.  Neurological:     Mental Status: She is alert and oriented to person, place, and time.     Cranial Nerves: No cranial nerve deficit.     Motor: No abnormal muscle tone.     Coordination: Coordination normal.     Deep Tendon Reflexes: Reflexes are normal and symmetric. Reflexes normal.  Psychiatric:        Behavior: Behavior normal.        Thought Content: Thought content normal.        Judgment: Judgment normal.          Assessment & Plan:  Well exam. We discussed  diet and exercise. Get fasting labs. She can use Benzonatate as needed for the post-Covid cough.  Alysia Penna, MD

## 2021-11-05 LAB — QUANTIFERON-TB GOLD PLUS
Mitogen-NIL: 8.68 IU/mL
NIL: 0.09 IU/mL
QuantiFERON-TB Gold Plus: NEGATIVE
TB1-NIL: 0 IU/mL
TB2-NIL: <0 IU/mL

## 2021-11-14 ENCOUNTER — Ambulatory Visit (INDEPENDENT_AMBULATORY_CARE_PROVIDER_SITE_OTHER): Payer: BC Managed Care – PPO | Admitting: Family Medicine

## 2021-11-14 VITALS — BP 110/78 | HR 107 | Temp 97.9°F | Wt 232.0 lb

## 2021-11-14 DIAGNOSIS — J039 Acute tonsillitis, unspecified: Secondary | ICD-10-CM | POA: Diagnosis not present

## 2021-11-14 MED ORDER — CEFTRIAXONE SODIUM 1 G IJ SOLR
1.0000 g | Freq: Once | INTRAMUSCULAR | Status: AC
  Administered 2021-11-14: 1 g via INTRAMUSCULAR

## 2021-11-14 MED ORDER — AMOXICILLIN-POT CLAVULANATE 875-125 MG PO TABS: 1.0000 | ORAL_TABLET | Freq: Two times a day (BID) | ORAL | 0 refills | Status: DC

## 2021-11-14 NOTE — Progress Notes (Signed)
° °  Subjective:    Patient ID: [name], female    DOB: 1975-11-14, 46 y.o.   MRN: [id number]  HPI Here for the onset yesterday of pain in the right side of her throat and neck, as well as swollen tender nodes on the right side of her neck. No fever or cough. She does not have a ST but swallowing is painful. No NVD. She took 800 mg of Ibuprofen and this eased the pain a little.    Review of Systems  Constitutional: Negative.   HENT:  Positive for postnasal drip and trouble swallowing. Negative for congestion, ear pain, sinus pressure and sore throat.   Eyes: Negative.   Respiratory: Negative.        Objective:   Physical Exam Constitutional:      Comments: She appears ill   HENT:     Right Ear: Tympanic membrane, ear canal and external ear normal.     Left Ear: Tympanic membrane, ear canal and external ear normal.     Nose: Nose normal.     Mouth/Throat:     Comments: The right tonsil is red and enlarged  Eyes:     Conjunctiva/sclera: Conjunctivae normal.  Neck:     Comments: There are several swollen and tender AC nodes on the right neck  Pulmonary:     Effort: Pulmonary effort is normal.     Breath sounds: Normal breath sounds.  Neurological:     Mental Status: She is alert.          Assessment & Plan:  Tonsillitis. Given a shot of Rocephin, to be followed by 10 days of Augmentin. Recheck as needed. Gershon Crane, MD

## 2021-11-14 NOTE — Addendum Note (Signed)
Addended by: Carola Rhine on: 11/14/2021 01:54 PM   Modules accepted: Orders

## 2022-05-27 ENCOUNTER — Telehealth (INDEPENDENT_AMBULATORY_CARE_PROVIDER_SITE_OTHER): Payer: BC Managed Care – PPO | Admitting: Family Medicine

## 2022-05-27 ENCOUNTER — Encounter: Payer: Self-pay | Admitting: Family Medicine

## 2022-05-27 DIAGNOSIS — B351 Tinea unguium: Secondary | ICD-10-CM | POA: Diagnosis not present

## 2022-05-27 NOTE — Progress Notes (Signed)
Virtual Visit via Video Note  I connected with [name] on 05/27/22 at  9:30 AM EDT by a video enabled telemedicine application 2/2 COVID-19 pandemic and verified that I am speaking with the correct person using two identifiers.  Location patient: home Location provider:work or home office Persons participating in the virtual visit: patient, provider  I discussed the limitations of evaluation and management by telemedicine and the availability of in person appointments. The patient expressed understanding and agreed to proceed.  Chief Complaint  Patient presents with   Nail Problem    Under nail bed on left ring finger. Noticed it 5-6 days ago, states husband was treated by Dr Clent Ridges for this but on his toes.    HPI: Patient is a 46 year old female followed by Dr. Clent Ridges and seen for acute concern.  Pt states she thinks she got a fungal nail infection from her husband who had a fungal toenail infection.  Pt notes a white patch on the nail for her L ring finger.  Patient has not tried anything for her symptoms.  ROS: See pertinent positives and negatives per HPI.  Past Medical History:  Diagnosis Date   History of gestational hypertension 2008   Pelvic pain in female    Psoriasis    followed by dr Elmon Else---    Seasonal allergies    Sinus infection    08-01-2017 started antibiotic   Wears contact lenses     Past Surgical History:  Procedure Laterality Date   CESAREAN SECTION  06-02-2007   dr Arelia Sneddon  [facility]   DILATION AND EVACUATION  12-16-2005    dr Arelia Sneddon  [facility]   IM NAILING FEMORAL SHAFT FRACTURE Left 2001   LAPAROSCOPY N/A 08/11/2017   Procedure: LAPAROSCOPY DIAGNOSTIC;  Surgeon: Richardean Chimera, MD;  Location: [facility] [place];  Service: Gynecology;  Laterality: N/A;    No family history on file.   Current Outpatient Medications:    Adalimumab (HUMIRA PEN [place]), Inject 1 pen into the skin. Every other week , Disp: , Rfl:    cetirizine (ZYRTEC) 10 MG tablet, Take 10  mg at bedtime by mouth., Disp: , Rfl:    desonide (DESOWEN) 0.05 % cream, Apply topically 2 (two) times daily., Disp: , Rfl:    Norethin Ace-Eth Estrad-FE 1-20 MG-MCG(24) CAPS, Take 1 tablet every evening by mouth., Disp: , Rfl:    amoxicillin-clavulanate (AUGMENTIN) 875-125 MG tablet, Take 1 tablet by mouth 2 (two) times daily. (Patient not taking: Reported on 05/27/2022), Disp: 20 tablet, Rfl: 0   benzonatate (TESSALON) 200 MG capsule, Take 1 capsule (200 mg total) by mouth 3 (three) times daily as needed for cough., Disp: 60 capsule, Rfl: 5  EXAM:  VITALS per patient if applicable:  RR between 12-20 bpm  GENERAL: alert, oriented, appears well and in no acute distress  HEENT: atraumatic, conjunctiva clear, no obvious abnormalities on inspection of external nose and ears  NECK: normal movements of the head and neck  LUNGS: on inspection no signs of respiratory distress, breathing rate appears normal, no obvious gross SOB, gasping or wheezing  SKIN: Left fourth digit nail with circular white patch at the distal nail, midline  CV: no obvious cyanosis  MS: moves all visible extremities without noticeable abnormality  PSYCH/NEURO: pleasant and cooperative, no obvious depression or anxiety, speech and thought processing grossly intact  ASSESSMENT AND PLAN:  Discussed the following assessment and plan:  Fungal nail infection -Left fourth digit affected -Discussed using OTC topical or polishes. -  Advised expected duration of symptoms with several months. -Discussed trimming nail and cleaning nail clippers thoroughly to prevent spread to other nails.  Follow-up as needed   I discussed the assessment and treatment plan with the patient. The patient was provided an opportunity to ask questions and all were answered. The patient agreed with the plan and demonstrated an understanding of the instructions.   The patient was advised to call back or seek an in-person evaluation if the symptoms  worsen or if the condition fails to improve as anticipated.    Meagan Saint, MD

## 2023-01-09 ENCOUNTER — Ambulatory Visit (INDEPENDENT_AMBULATORY_CARE_PROVIDER_SITE_OTHER): Payer: BC Managed Care – PPO | Admitting: Family Medicine

## 2023-01-09 ENCOUNTER — Encounter: Payer: Self-pay | Admitting: Family Medicine

## 2023-01-09 VITALS — BP 104/70 | HR 89 | Temp 98.1°F | Ht 65.25 in | Wt 208.0 lb

## 2023-01-09 DIAGNOSIS — F418 Other specified anxiety disorders: Secondary | ICD-10-CM

## 2023-01-09 DIAGNOSIS — Z0000 Encounter for general adult medical examination without abnormal findings: Secondary | ICD-10-CM

## 2023-01-09 LAB — CBC WITH DIFFERENTIAL/PLATELET
Basophils Absolute: 0 10*3/uL (ref 0.0–0.1)
Basophils Relative: 0.6 % (ref 0.0–3.0)
Eosinophils Absolute: 0.2 10*3/uL (ref 0.0–0.7)
Eosinophils Relative: 3.3 % (ref 0.0–5.0)
HCT: 43.9 % (ref 36.0–46.0)
Hemoglobin: 15 g/dL (ref 12.0–15.0)
Lymphocytes Relative: 34 % (ref 12.0–46.0)
Lymphs Abs: 2.4 10*3/uL (ref 0.7–4.0)
MCHC: 34 g/dL (ref 30.0–36.0)
MCV: 92.4 fl (ref 78.0–100.0)
Monocytes Absolute: 0.8 10*3/uL (ref 0.1–1.0)
Monocytes Relative: 10.9 % (ref 3.0–12.0)
Neutro Abs: 3.5 10*3/uL (ref 1.4–7.7)
Neutrophils Relative %: 51.2 % (ref 43.0–77.0)
Platelets: 317 10*3/uL (ref 150.0–400.0)
RBC: 4.75 Mil/uL (ref 3.87–5.11)
RDW: 12.8 % (ref 11.5–15.5)
WBC: 6.9 10*3/uL (ref 4.0–10.5)

## 2023-01-09 LAB — HEPATIC FUNCTION PANEL
ALT: 20 U/L (ref 0–35)
AST: 19 U/L (ref 0–37)
Albumin: 4.1 g/dL (ref 3.5–5.2)
Alkaline Phosphatase: 74 U/L (ref 39–117)
Bilirubin, Direct: 0.1 mg/dL (ref 0.0–0.3)
Total Bilirubin: 0.5 mg/dL (ref 0.2–1.2)
Total Protein: 8 g/dL (ref 6.0–8.3)

## 2023-01-09 LAB — LIPID PANEL
Cholesterol: 145 mg/dL (ref 0–200)
HDL: 51.8 mg/dL (ref >39.00)
LDL Cholesterol: 80 mg/dL (ref 0–99)
NonHDL: 93.01
Total CHOL/HDL Ratio: 3
Triglycerides: 65 mg/dL (ref 0.0–149.0)
VLDL: 13 mg/dL (ref 0.0–40.0)

## 2023-01-09 LAB — BASIC METABOLIC PANEL
BUN: 18 mg/dL (ref 6–23)
CO2: 25 mEq/L (ref 19–32)
Calcium: 9.4 mg/dL (ref 8.4–10.5)
Chloride: 106 mEq/L (ref 96–112)
Creatinine, Ser: 0.73 mg/dL (ref 0.40–1.20)
GFR: 98.84 mL/min (ref >60.00)
Glucose, Bld: 79 mg/dL (ref 70–99)
Potassium: 4.3 mEq/L (ref 3.5–5.1)
Sodium: 139 mEq/L (ref 135–145)

## 2023-01-09 LAB — TSH: TSH: 1.83 u[IU]/mL (ref 0.35–5.50)

## 2023-01-09 LAB — HEMOGLOBIN A1C: Hgb A1c MFr Bld: 5.2 % (ref 4.6–6.5)

## 2023-01-09 MED ORDER — FLUOXETINE HCL 10 MG PO CAPS: 10.0000 mg | ORAL_CAPSULE | Freq: Every day | ORAL | 3 refills | Status: DC
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
## 2023-01-31 ENCOUNTER — Other Ambulatory Visit: Payer: Self-pay | Admitting: Family Medicine

## 2023-02-06 ENCOUNTER — Ambulatory Visit (INDEPENDENT_AMBULATORY_CARE_PROVIDER_SITE_OTHER): Payer: BC Managed Care – PPO | Admitting: Family Medicine

## 2023-02-06 ENCOUNTER — Encounter: Payer: Self-pay | Admitting: Family Medicine

## 2023-02-06 VITALS — BP 102/68 | HR 76 | Temp 98.4°F | Wt 205.0 lb

## 2023-02-06 DIAGNOSIS — F418 Other specified anxiety disorders: Secondary | ICD-10-CM | POA: Diagnosis not present

## 2023-02-06 MED ORDER — BUSPIRONE HCL 5 MG PO TABS
5.0000 mg | ORAL_TABLET | Freq: Two times a day (BID) | ORAL | 2 refills | Status: DC
Start: 2023-02-06 — End: 2023-03-04

## 2023-02-06 MED ORDER — LORAZEPAM 0.5 MG PO TABS: 0.5000 mg | ORAL_TABLET | Freq: Three times a day (TID) | ORAL | 0 refills | Status: DC | PRN

## 2023-02-06 NOTE — Progress Notes (Signed)
   Subjective:    Patient ID: [name], female    DOB: 1976/03/23, 47 y.o.   MRN: [id number]  HPI Here to follow up on anxiety and depression. We discussed this at her well exam a few weeks ago, and she has been taking Fluoxetine 10 mg daily. She says this has actually made her feel worse. She feels much more anxious, she is tearful, and she cannot sleep. She still has not heard back from the EAP therapists at her job either.    Review of Systems  Constitutional: Negative.   Respiratory: Negative.    Cardiovascular: Negative.   Psychiatric/Behavioral:  Positive for decreased concentration, dysphoric mood and sleep disturbance. Negative for agitation, behavioral problems, confusion and hallucinations. The patient is nervous/anxious.        Objective:   Physical Exam Constitutional:      Appearance: Normal appearance.  Cardiovascular:     Rate and Rhythm: Normal rate and regular rhythm.     Pulses: Normal pulses.     Heart sounds: Normal heart sounds.  Pulmonary:     Effort: Pulmonary effort is normal.     Breath sounds: Normal breath sounds.  Neurological:     Mental Status: She is alert.  Psychiatric:        Behavior: Behavior normal.        Thought Content: Thought content normal.     Comments: Anxious and tearful           Assessment & Plan:  Anxiety and depression. We will stop the Fluoxetine and will start her on Buspirone 5 mg BID. Add Lorazepam 0.5 mg as needed. I encouraged her to keep in contact with the EAP program. Recheck in 3 weeks.  Gershon Crane, MD

## 2023-02-12 ENCOUNTER — Encounter: Payer: Self-pay | Admitting: Family Medicine

## 2023-02-13 NOTE — Telephone Encounter (Signed)
No referral to a therapist is needed. I suggest she call [facility] at [phone]

## 2023-02-28 ENCOUNTER — Other Ambulatory Visit: Payer: Self-pay | Admitting: Family Medicine

## 2023-03-06 ENCOUNTER — Encounter: Payer: Self-pay | Admitting: Family Medicine

## 2023-03-06 ENCOUNTER — Ambulatory Visit (INDEPENDENT_AMBULATORY_CARE_PROVIDER_SITE_OTHER): Payer: BC Managed Care – PPO | Admitting: Family Medicine

## 2023-03-06 VITALS — BP 110/78 | HR 85 | Temp 98.2°F | Wt 207.0 lb

## 2023-03-06 DIAGNOSIS — F418 Other specified anxiety disorders: Secondary | ICD-10-CM | POA: Diagnosis not present

## 2023-03-06 NOTE — Progress Notes (Signed)
   Subjective:    Patient ID: [name], female    DOB: 08/24/1976, 47 y.o.   MRN: [id number]  HPI Here to follow up on anxiety and depression. Since starting on Buspar, she has been doing very well. She feels great, and she has no side effects. She says her daughter has been in the hospital this week for a UTI, and instead of "freaking out" she has been dealing with the stress quite well.    Review of Systems  Constitutional: Negative.   Respiratory: Negative.    Cardiovascular: Negative.   Psychiatric/Behavioral: Negative.         Objective:   Physical Exam Constitutional:      Appearance: Normal appearance.  Cardiovascular:     Rate and Rhythm: Normal rate and regular rhythm.     Pulses: Normal pulses.     Heart sounds: Normal heart sounds.  Pulmonary:     Effort: Pulmonary effort is normal.     Breath sounds: Normal breath sounds.  Neurological:     Mental Status: She is alert.  Psychiatric:        Mood and Affect: Mood normal.        Behavior: Behavior normal.        Thought Content: Thought content normal.     Comments: She laughs freely            Assessment & Plan:  Anxiety and depression, much improved. She will stay on this regimen.  Gershon Crane, MD

## 2023-03-24 ENCOUNTER — Ambulatory Visit (INDEPENDENT_AMBULATORY_CARE_PROVIDER_SITE_OTHER): Payer: BC Managed Care – PPO | Admitting: Psychology

## 2023-03-24 DIAGNOSIS — F418 Other specified anxiety disorders: Secondary | ICD-10-CM | POA: Diagnosis not present
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
## 2023-03-28 LAB — HM COLONOSCOPY

## 2023-03-31 ENCOUNTER — Ambulatory Visit (INDEPENDENT_AMBULATORY_CARE_PROVIDER_SITE_OTHER): Payer: BC Managed Care – PPO | Admitting: Psychology

## 2023-03-31 DIAGNOSIS — F418 Other specified anxiety disorders: Secondary | ICD-10-CM

## 2023-03-31 NOTE — Progress Notes (Signed)
[facility] Counselor/Therapist Progress Note  Patient ID: [name], MRN: [id number]   Date: 03/31/23  Time Spent: 1:33  pm - 2:28 pm : 55 Minutes  Treatment Type: Individual Therapy.  Reported Symptoms: depression and anxiety.   Mental Status Exam: Appearance:  Casual     Behavior: Appropriate  Motor: Normal  Speech/Language:  Clear and Coherent  Affect: Appropriate  Mood: normal  Thought process: normal  Thought content:   WNL  Sensory/Perceptual disturbances:   WNL  Orientation: oriented to person, place, time/date, and situation  Attention: Good  Concentration: Good  Memory: WNL  Fund of knowledge:  Good  Insight:   Good  Judgment:  Good  Impulse Control: Good   Risk Assessment: Danger to Self:  No Self-injurious Behavior: No Danger to Others: No Duty to Warn:no Physical Aggression / Violence:No  Access to Firearms a concern: No  Gang Involvement:No   Subjective:   [name] participated from home, via video, is aware of the limitations of tele-sessions, and consented to treatment. Therapist participated from office. I discussed the limitations of evaluation and management by telemedicine and the availability of in person appointments. The patient expressed understanding and agreed to proceed. [name] reviewed the events of the past week. Therapist encouraged [name] to check-in in the am and pm in regards to mood, thoughts, and physiological symptoms. We reviewed numerous treatment approaches including CBT, BA, Problem Solving, and Solution focused therapy. Psych-education regarding the [name]'s diagnosis of Anxiety with depression was provided during the session. We discussed [name]'s goals treatment goals which include identifying areas of control and lack of control, managing symptoms, processing past events, boundaries for self and others (not absorbing people's emotional state), managing stressors reactively and  proactively, improving distress tolerance, managing negative self-talk and expectations for self. [name] provided verbal approval of the treatment plan.   Interventions: Psycho-education & Goal Setting.   Diagnosis:  Anxiety with depression  Psychiatric Treatment: Yes , via PCP. Please see chart.   Treatment Plan:  Client Abilities/Strengths [name] is intelligent, self-aware, and motivated for change.   Support System: Family and friends.   Client Treatment Preferences OPT   Client Statement of Needs [name] would like to identifying areas of control and lack of control, managing symptoms, processing past events, boundaries for self and others (not absorbing people's emotional state), managing stressors reactively and proactively, improving distress tolerance, managing negative self-talk and expectations for self.   Treatment Level Weekly  Symptoms  Anxiety: feeling anxious, difficulty managing worry, worrying about different things, trouble relaxing, trouble relaxing, restlessness, irritability.   (Status: maintained) Depression: loss of interest, poor sleep, lethargy, feeling bad about self.    (Status: maintained)  Goals:   [name] has symptoms of depression and depression.   Treatment plan signed and available on s-drive:  No, pending signature.    Target Date: 03/30/24 Frequency: Weekly  Progress: 0 Modality: individual    Therapist will provide referrals for additional resources as appropriate.  Therapist will provide psycho-education regarding [name] diagnosis and corresponding treatment approaches and interventions. Licensed Clinical Social Worker, [place], LCSW will support the patient's ability to achieve the goals identified. will employ CBT, BA, Problem-solving, Solution Focused, Mindfulness,  coping skills, & other evidenced-based practices will be used to promote progress towards healthy functioning to help manage decrease symptoms  associated with her diagnosis.   Reduce overall level, frequency, and intensity of the feelings of depression, anxiety and panic evidenced by decreased  overall symptoms from 6 to 7 days/week to 0 to 1 days/week per client report for at least 3 consecutive months. Verbally express understanding of the relationship between feelings of depression, anxiety and their impact on thinking patterns and behaviors. Verbalize an understanding of the role that distorted thinking plays in creating fears, excessive worry, and ruminations.   [name] participated in the creation of the treatment plan)   [name], LCSW

## 2023-04-28 ENCOUNTER — Ambulatory Visit (INDEPENDENT_AMBULATORY_CARE_PROVIDER_SITE_OTHER): Payer: BC Managed Care – PPO | Admitting: Psychology

## 2023-04-28 DIAGNOSIS — F418 Other specified anxiety disorders: Secondary | ICD-10-CM | POA: Diagnosis not present
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
## 2023-04-29 ENCOUNTER — Ambulatory Visit: Payer: BC Managed Care – PPO | Admitting: Psychology

## 2023-05-12 ENCOUNTER — Ambulatory Visit: Payer: BC Managed Care – PPO | Admitting: Psychology

## 2023-05-13 ENCOUNTER — Other Ambulatory Visit: Payer: Self-pay | Admitting: Family Medicine

## 2023-05-13 MED ORDER — LORAZEPAM 0.5 MG PO TABS: 0.5000 mg | ORAL_TABLET | Freq: Three times a day (TID) | ORAL | 5 refills | Status: DC | PRN

## 2023-05-16 ENCOUNTER — Ambulatory Visit: Payer: BC Managed Care – PPO | Admitting: Psychology

## 2023-05-16 DIAGNOSIS — F418 Other specified anxiety disorders: Secondary | ICD-10-CM | POA: Diagnosis not present

## 2023-05-16 NOTE — Progress Notes (Signed)
[facility] Counselor/Therapist Progress Note  Patient ID: [name], MRN: [id number]   Date: 05/16/23  Time Spent: 4:03  pm - 4:46  pm : 43 Minutes  Treatment Type: Individual Therapy.  Reported Symptoms: depression and anxiety.   Mental Status Exam: Appearance:  Casual     Behavior: Appropriate  Motor: Normal  Speech/Language:  Pressured  Affect: Appropriate  Mood: normal  Thought process: normal  Thought content:   WNL  Sensory/Perceptual disturbances:   WNL  Orientation: oriented to person, place, time/date, and situation  Attention: Good  Concentration: Good  Memory: WNL  Fund of knowledge:  Good  Insight:   Good  Judgment:  Good  Impulse Control: Good   Risk Assessment: Danger to Self:  No Self-injurious Behavior: No Danger to Others: No Duty to Warn:no Physical Aggression / Violence:No  Access to Firearms a concern: No  Gang Involvement:No   Subjective:   [name] participated from her home, via video, is aware of the limitations of tele-session, and consented to treatment. Therapist participated from home office. [name] reviewed the events of the past week. [name] endorsed "overwhelming anxiety" at work due to moving to new building and noted numerous logistical issues. She noted her ativan not being effective in managing her symptoms. She noted her attempts to engage in relaxation exercises to no avail but discussed [facility] app for relaxation being effective in aiding in managing her anxiety and allowing her to return to sleep. We discussed the importance of identifying areas of control vs. Lack of control.  She noted a need to ensure she engages in self-care on a consistent basis. We worked on identifying barriers to engagement in self-care. We discussed scheduling her exercises, preparing her meals ahead of time, and exercising with a friends. We discussed creating a daily itinerary for self-care that would fit well with her workday.  We worked on challenging negative thoughts and feelings, putting stressors in perspective, and normalizing the contextually appropriate stress. Therapist provided psycho-education regarding challenging negative thoughts and feelings, modeled this during the session, and provided a handout via email for reference and review. [name] was engaged and motivated during the session and expressed commitment towards our goals in the session. Therapist praised [name] for her effort and provided supportive therapy. A follow-up was scheduled.   Interventions: CBT  Diagnosis:  Anxiety with depression  Psychiatric Treatment: Yes , via PCP. Please see chart.   Treatment Plan:  Client Abilities/Strengths [name] is intelligent, self-aware, and motivated for change.   Support System: Family and friends.   Client Treatment Preferences OPT   Client Statement of Needs [name] would like to identifying areas of control and lack of control, managing symptoms, processing past events, boundaries for self and others (not absorbing people's emotional state), managing stressors reactively and proactively, improving distress tolerance, managing negative self-talk and expectations for self.   Treatment Level Weekly  Symptoms  Anxiety: feeling anxious, difficulty managing worry, worrying about different things, trouble relaxing, trouble relaxing, restlessness, irritability.   (Status: maintained) Depression: loss of interest, poor sleep, lethargy, feeling bad about self.    (Status: maintained)  Goals:   [name] has symptoms of depression and depression.   Treatment plan signed and available on s-drive:  No, pending signature.    Target Date: 03/30/24 Frequency: Weekly  Progress: 0 Modality: individual    Therapist will provide referrals for additional resources as appropriate.  Therapist will provide psycho-education regarding [name]'s diagnosis and corresponding treatment approaches and  interventions.  Licensed Clinical Social Worker, [place], LCSW will support the patient's ability to achieve the goals identified. will employ CBT, BA, Problem-solving, Solution Focused, Mindfulness,  coping skills, & other evidenced-based practices will be used to promote progress towards healthy functioning to help manage decrease symptoms associated with her diagnosis.   Reduce overall level, frequency, and intensity of the feelings of depression, anxiety and panic evidenced by decreased overall symptoms from 6 to 7 days/week to 0 to 1 days/week per client report for at least 3 consecutive months. Verbally express understanding of the relationship between feelings of depression, anxiety and their impact on thinking patterns and behaviors. Verbalize an understanding of the role that distorted thinking plays in creating fears, excessive worry, and ruminations.   [name] participated in the creation of the treatment plan)   [name], LCSW

## 2023-05-27 ENCOUNTER — Ambulatory Visit (INDEPENDENT_AMBULATORY_CARE_PROVIDER_SITE_OTHER): Payer: BC Managed Care – PPO | Admitting: Psychology

## 2023-05-27 DIAGNOSIS — F418 Other specified anxiety disorders: Secondary | ICD-10-CM | POA: Diagnosis not present

## 2023-05-27 NOTE — Progress Notes (Signed)
[facility] Counselor/Therapist Progress Note  Patient ID: [name], MRN: [id number]   Date: 05/27/23  Time Spent: 4:02  pm - 4:51 pm : 49 Minutes  Treatment Type: Individual Therapy.  Reported Symptoms: depression and anxiety.   Mental Status Exam: Appearance:  Casual     Behavior: Appropriate  Motor: Normal  Speech/Language:  Pressured  Affect: Appropriate  Mood: normal  Thought process: normal  Thought content:   WNL  Sensory/Perceptual disturbances:   WNL  Orientation: oriented to person, place, time/date, and situation  Attention: Good  Concentration: Good  Memory: WNL  Fund of knowledge:  Good  Insight:   Good  Judgment:  Good  Impulse Control: Good   Risk Assessment: Danger to Self:  No Self-injurious Behavior: No Danger to Others: No Duty to Warn:no Physical Aggression / Violence:No  Access to Firearms a concern: No  Gang Involvement:No   Subjective:   [name] participated from the office with the therapist and consented to treatment. She reviewed the events of the past week. She noted the past week being quite stressful due to the start of school. She noted working on communicating expectations for the start of the year and noted this providing her some relief in anxiety. She noted a possible unexpected role change at work and noted this being stressful as she might have to do two roles concurrently. She noted a need to communicate her concerns and be more assertive to get an answer regarding this possible transition. Therapist praised Djenaba for this and provided psycho-education regarding avoidance and the effect of this on increasing physiological and psychological tension. She noted having an in-depth conversation regarding her daughter's past trauma and ways to maintain safety. She noted often feeling like the mediator between her husband and their daughter. We explored this during the session and ways to set and maintain  boundaries. Therapist modeled boundary setting during the session. [name] was engaged and motivated during the session. She expressed commitment towards session goals. A follow-up was scheduled for continued treatment.   Interventions: CBT & interpresonal  Diagnosis:  Anxiety with depression  Psychiatric Treatment: Yes , via PCP. Please see chart.   Treatment Plan:  Client Abilities/Strengths [name] is intelligent, self-aware, and motivated for change.   Support System: Family and friends.   Client Treatment Preferences OPT   Client Statement of Needs [name] would like to identifying areas of control and lack of control, managing symptoms, processing past events, boundaries for self and others (not absorbing people's emotional state), managing stressors reactively and proactively, improving distress tolerance, managing negative self-talk and expectations for self.   Treatment Level Weekly  Symptoms  Anxiety: feeling anxious, difficulty managing worry, worrying about different things, trouble relaxing, trouble relaxing, restlessness, irritability.   (Status: maintained) Depression: loss of interest, poor sleep, lethargy, feeling bad about self.    (Status: maintained)  Goals:   [name] has symptoms of depression and depression.   Treatment plan signed and available on s-drive:  No, pending signature.    Target Date: 03/30/24 Frequency: Weekly  Progress: 0 Modality: individual    Therapist will provide referrals for additional resources as appropriate.  Therapist will provide psycho-education regarding [name]'s diagnosis and corresponding treatment approaches and interventions. Licensed Clinical Social Worker, [place], LCSW will support the patient's ability to achieve the goals identified. will employ CBT, BA, Problem-solving, Solution Focused, Mindfulness,  coping skills, & other evidenced-based practices will be used to promote progress towards healthy  functioning to help  manage decrease symptoms associated with her diagnosis.   Reduce overall level, frequency, and intensity of the feelings of depression, anxiety and panic evidenced by decreased overall symptoms from 6 to 7 days/week to 0 to 1 days/week per client report for at least 3 consecutive months. Verbally express understanding of the relationship between feelings of depression, anxiety and their impact on thinking patterns and behaviors. Verbalize an understanding of the role that distorted thinking plays in creating fears, excessive worry, and ruminations.   [name] participated in the creation of the treatment plan)   [name], LCSW

## 2023-06-16 ENCOUNTER — Ambulatory Visit (INDEPENDENT_AMBULATORY_CARE_PROVIDER_SITE_OTHER): Payer: BC Managed Care – PPO | Admitting: Psychology

## 2023-06-16 DIAGNOSIS — F418 Other specified anxiety disorders: Secondary | ICD-10-CM | POA: Diagnosis not present

## 2023-06-16 NOTE — Progress Notes (Signed)
[facility] Counselor/Therapist Progress Note  Patient ID: [name], MRN: [id number]   Date: 06/16/23  Time Spent: 3:17  pm - 4:11 pm : 54  Minutes  Treatment Type: Individual Therapy.  Reported Symptoms: depression and anxiety.   Mental Status Exam: Appearance:  Casual     Behavior: Appropriate  Motor: Normal  Speech/Language:  Pressured  Affect: Appropriate  Mood: normal  Thought process: normal  Thought content:   WNL  Sensory/Perceptual disturbances:   WNL  Orientation: oriented to person, place, time/date, and situation  Attention: Good  Concentration: Good  Memory: WNL  Fund of knowledge:  Good  Insight:   Good  Judgment:  Good  Impulse Control: Good   Risk Assessment: Danger to Self:  No Self-injurious Behavior: No Danger to Others: No Duty to Warn:no Physical Aggression / Violence:No  Access to Firearms a concern: No  Gang Involvement:No   Subjective:   [name] participated from the office with the therapist and consented to treatment. She the events of the past week. [name] noted feeling stressed for the past few weeks and noted having to take her Ativan PRN. She noted her stressors including her daughter not being home as often due to school and starting a new job. She noted that she was not prepared for this transition. She noted a lack of acknowledgement of her daughter's birthday by her in-laws. She noted having busy days at work and noted difficulty relaxing when off of work.She noted having a difficult reviewed time unwinding from these days. She noted waking up, in the middle of the night, with an elevated heart-rate. She noted feeling "shame" for taking her PRN medication more often. She noted a recent skin cancer scare. She noted being in pain in the past 3 months due to numerous dental visits, a colonoscopy, two separate mole removals from back, and two sets of stitches. She noted difficulty transitioning from the fast  pace of work to the slower pace at home. She noted her attempts to employ muscle relaxation with some success when attempting to go back to sleep after waking. We worked on identifying transitionary behaviors to engage in between work and home including exercise at work or at [employer]. She noted anxiety regarding the transition to having one of her children out of the home more often and what it will look like when both children are out of the home. We discussed ways to build a routine with her husband and maintain the relationship outside of parenting roles. We worked on identifying ways to reconnect including date nights, questions that could change the daily monotony household tasks and parenting.  We reviewed 4-7-8 breathing during the session and provided psycho-education regarding breathing and the effect on anxiety. [name] was engaged and motivated during the session. She expressed commitment towards goals. Therapist praised [name] and provided supportive therapy. A follow-up was scheduled for continued treatment.   Interventions: CBT & interpresonal  Diagnosis:  Anxiety with depression  Psychiatric Treatment: Yes , via PCP. Please see chart.   Treatment Plan:  Client Abilities/Strengths [name] is intelligent, self-aware, and motivated for change.   Support System: Family and friends.   Client Treatment Preferences OPT   Client Statement of Needs [name] would like to identifying areas of control and lack of control, managing symptoms, processing past events, boundaries for self and others (not absorbing people's emotional state), managing stressors reactively and proactively, improving distress tolerance, managing negative self-talk and expectations for self.  Treatment Level Weekly  Symptoms  Anxiety: feeling anxious, difficulty managing worry, worrying about different things, trouble relaxing, trouble relaxing, restlessness, irritability.   (Status:  maintained) Depression: loss of interest, poor sleep, lethargy, feeling bad about self.    (Status: maintained)  Goals:   [name] has symptoms of depression and depression.   Treatment plan signed and available on s-drive:  No, pending signature.    Target Date: 03/30/24 Frequency: Weekly  Progress: 0 Modality: individual    Therapist will provide referrals for additional resources as appropriate.  Therapist will provide psycho-education regarding [name]'s diagnosis and corresponding treatment approaches and interventions. Licensed Clinical Social Worker, [place], LCSW will support the patient's ability to achieve the goals identified. will employ CBT, BA, Problem-solving, Solution Focused, Mindfulness,  coping skills, & other evidenced-based practices will be used to promote progress towards healthy functioning to help manage decrease symptoms associated with her diagnosis.   Reduce overall level, frequency, and intensity of the feelings of depression, anxiety and panic evidenced by decreased overall symptoms from 6 to 7 days/week to 0 to 1 days/week per client report for at least 3 consecutive months. Verbally express understanding of the relationship between feelings of depression, anxiety and their impact on thinking patterns and behaviors. Verbalize an understanding of the role that distorted thinking plays in creating fears, excessive worry, and ruminations.   [name] participated in the creation of the treatment plan)   Meagan Ovens, LCSW

## 2023-07-07 ENCOUNTER — Telehealth: Payer: BC Managed Care – PPO | Admitting: Family Medicine

## 2023-07-07 ENCOUNTER — Encounter: Payer: Self-pay | Admitting: Family Medicine

## 2023-07-07 ENCOUNTER — Ambulatory Visit: Payer: BC Managed Care – PPO | Admitting: Psychology

## 2023-07-07 VITALS — Wt 205.0 lb

## 2023-07-07 DIAGNOSIS — F418 Other specified anxiety disorders: Secondary | ICD-10-CM

## 2023-07-07 MED ORDER — BUSPIRONE HCL 5 MG PO TABS: 10.0000 mg | ORAL_TABLET | Freq: Two times a day (BID) | ORAL | Status: DC

## 2023-07-07 MED ORDER — TEMAZEPAM 30 MG PO CAPS: 30.0000 mg | ORAL_CAPSULE | Freq: Every evening | ORAL | 5 refills | Status: DC | PRN

## 2023-07-07 NOTE — Progress Notes (Signed)
Subjective:    Patient ID: [name], female    DOB: August 06, 1976, 47 y.o.   MRN: [id number]  HPI Virtual Visit via Video Note  I connected with the patient on 07/07/23 at  2:00 PM EDT by a video enabled telemedicine application and verified that I am speaking with the correct person using two identifiers.  Location patient: home Location provider:work or home office Persons participating in the virtual visit: patient, provider  I discussed the limitations of evaluation and management by telemedicine and the availability of in person appointments. The patient expressed understanding and agreed to proceed.   HPI: Here for help with her anxiety. Since we last spoke she has had a number of things go wrong in her life, including family medical issues and her own dental issues. She has been dealing with a tremendous amount of anxiety. She feels like her body never relxes, she cries a lot, she cannot sleep, and she has had diarrhea. She is taking Buspar 5 mg BID and some occasional Lorazepam. She is meeting with her therapist regularly.    ROS: See pertinent positives and negatives per HPI.  Past Medical History:  Diagnosis Date   History of gestational hypertension 2008   Pelvic pain in female    Psoriasis    followed by dr Elmon Else---    Seasonal allergies    Sinus infection    08-01-2017 started antibiotic   Wears contact lenses     Past Surgical History:  Procedure Laterality Date   CESAREAN SECTION  06-02-2007   dr Arelia Sneddon  [facility]   DILATION AND EVACUATION  12-16-2005    dr Arelia Sneddon  [facility]   IM NAILING FEMORAL SHAFT FRACTURE Left 2001   LAPAROSCOPY N/A 08/11/2017   Procedure: LAPAROSCOPY DIAGNOSTIC;  Surgeon: Richardean Chimera, MD;  Location: [facility] [place];  Service: Gynecology;  Laterality: N/A;    History reviewed. No pertinent family history.   Current Outpatient Medications:    Adalimumab (HUMIRA PEN [place]), Inject 1 pen into the skin. Every other week , Disp: ,  Rfl:    busPIRone (BUSPAR) 5 MG tablet, TAKE 1 TABLET BY MOUTH TWICE A DAY, Disp: 180 tablet, Rfl: 3   cetirizine (ZYRTEC) 10 MG tablet, Take 10 mg at bedtime by mouth., Disp: , Rfl:    desonide (DESOWEN) 0.05 % cream, Apply topically as needed., Disp: , Rfl:    LORazepam (ATIVAN) 0.5 MG tablet, Take 1 tablet (0.5 mg total) by mouth every 8 (eight) hours as needed for anxiety., Disp: 90 tablet, Rfl: 5   Norethin Ace-Eth Estrad-FE 1-20 MG-MCG(24) CAPS, Take 1 tablet by mouth daily., Disp: , Rfl:   EXAM:  VITALS per patient if applicable:  GENERAL: alert, oriented, appears well and in no acute distress  HEENT: atraumatic, conjunttiva clear, no obvious abnormalities on inspection of external nose and ears  NECK: normal movements of the head and neck  LUNGS: on inspection no signs of respiratory distress, breathing rate appears normal, no obvious gross SOB, gasping or wheezing  CV: no obvious cyanosis  MS: moves all visible extremities without noticeable abnormality  PSYCH/NEURO: pleasant and cooperative, no obvious depression or anxiety, speech and thought processing grossly intact  ASSESSMENT AND PLAN: Anxiety with depression. We will increase the Buspar to 10 mg BID, and she can still use Lorazepam as needed. We will add Temazepam 30 mg at bedtime for sleep. Follow up in one week.  Gershon Crane, MD  Discussed the following assessment and plan:  No diagnosis found.     I discussed the assessment and treatment plan with the patient. The patient was provided an opportunity to ask questions and all were answered. The patient agreed with the plan and demonstrated an understanding of the instructions.   The patient was advised to call back or seek an in-person evaluation if the symptoms worsen or if the condition fails to improve as anticipated.      Review of Systems     Objective:   Physical Exam        Assessment & Plan:

## 2023-07-07 NOTE — Progress Notes (Signed)
[facility] Counselor/Therapist Progress Note  Patient ID: BRISEIS AGUILERA, MRN: [id number]   Date: 07/07/23  Time Spent: 3:16  pm - 4:!5 pm : 59  Minutes  Treatment Type: Individual Therapy.  Reported Symptoms: depression and anxiety.   Mental Status Exam: Appearance:  Casual     Behavior: Appropriate  Motor: Normal  Speech/Language:  Pressured  Affect: Appropriate  Mood: normal  Thought process: normal  Thought content:   WNL  Sensory/Perceptual disturbances:   WNL  Orientation: oriented to person, place, time/date, and situation  Attention: Good  Concentration: Good  Memory: WNL  Fund of knowledge:  Good  Insight:   Good  Judgment:  Good  Impulse Control: Good   Risk Assessment: Danger to Self:  No Self-injurious Behavior: No Danger to Others: No Duty to Warn:no Physical Aggression / Violence:No  Access to Firearms a concern: No  Gang Involvement:No   Subjective:   [name] participated from the office with the therapist and consented to treatment. She the events of the past week. Lia met with her medical provider due to her increased anxiety and poor sleep and noted medication changes by her provider. Jeronica's Buspar prescription was increased to 10 mg BID and, additionally, she was prescribed Temazepam 30 mg for sleep. Debera endorsed numerous panic attacks due to her family who lives in [place]. She noted significant worry about her family prior to hearing back from family. She noted, prior to making contact, she noted being "glued to the TV" and highlighted this as "destructive". She noted her husband noting that Lonie often puts immense amounts of pressure on self regarding mundane and innocuous. She provided numerous examples of this during the session. She noted often getting "really worked up" over these unmet and uncommunicated expectations. We highlighted her level of expectations she places on her self and the feedback she  gets from being in this mode. She noted that others would see her day being over scheduled and pressure filled. She noted her sense of self being tied to her level of performance. She noted contributing factors to her anxiety including her daughter's recent struggles due to an assault. We explored this during the session. Therapist validated Estrellita's feelings and experience during the session. Therapist encouraged Emmalin to be mindful of her expectations, setting reasonable and attainable expectations, and identifying the costs of unrealistic expectations. Mariela was engaged and motivated during the session. She expressed commitment towards goals. Therapist praised Yanina for her effort and provided supportive therapy. A follow-up was scheduled for continued treatment.   Diagnosis:  Anxiety with depression  Intervention: CBT  Psychiatric Treatment: Yes , via PCP. Please see chart.   Treatment Plan:  Client Abilities/Strengths Tesha is intelligent, self-aware, and motivated for change.   Support System: Family and friends.   Client Treatment Preferences OPT   Client Statement of Needs [place] would like to identifying areas of control and lack of control, managing symptoms, processing past events, boundaries for self and others (not absorbing people's emotional state), managing stressors reactively and proactively, improving distress tolerance, managing negative self-talk and expectations for self.   Treatment Level Weekly  Symptoms  Anxiety: feeling anxious, difficulty managing worry, worrying about different things, trouble relaxing, trouble relaxing, restlessness, irritability.   (Status: maintained) Depression: loss of interest, poor sleep, lethargy, feeling bad about self.    (Status: maintained)  Goals:   Chequita has symptoms of depression and depression.   Treatment plan signed and available on s-drive:  No,  pending signature.    Target Date: 03/30/24 Frequency:  Weekly  Progress: 0 Modality: individual    Therapist will provide referrals for additional resources as appropriate.  Therapist will provide psycho-education regarding Trang's diagnosis and corresponding treatment approaches and interventions. Licensed Clinical Social Worker, [place], LCSW will support the patient's ability to achieve the goals identified. will employ CBT, BA, Problem-solving, Solution Focused, Mindfulness,  coping skills, & other evidenced-based practices will be used to promote progress towards healthy functioning to help manage decrease symptoms associated with her diagnosis.   Reduce overall level, frequency, and intensity of the feelings of depression, anxiety and panic evidenced by decreased overall symptoms from 6 to 7 days/week to 0 to 1 days/week per client report for at least 3 consecutive months. Verbally express understanding of the relationship between feelings of depression, anxiety and their impact on thinking patterns and behaviors. Verbalize an understanding of the role that distorted thinking plays in creating fears, excessive worry, and ruminations.   [name] participated in the creation of the treatment plan)   [name], LCSW

## 2023-07-14 ENCOUNTER — Telehealth: Payer: BC Managed Care – PPO | Admitting: Family Medicine

## 2023-07-14 DIAGNOSIS — F418 Other specified anxiety disorders: Secondary | ICD-10-CM

## 2023-07-14 DIAGNOSIS — G4700 Insomnia, unspecified: Secondary | ICD-10-CM | POA: Insufficient documentation

## 2023-07-14 MED ORDER — BUSPIRONE HCL 10 MG PO TABS: 10.0000 mg | ORAL_TABLET | Freq: Two times a day (BID) | ORAL | 5 refills | Status: DC

## 2023-07-14 MED ORDER — ZOLPIDEM TARTRATE 10 MG PO TABS: 10.0000 mg | ORAL_TABLET | Freq: Every evening | ORAL | 3 refills | Status: DC | PRN

## 2023-07-14 NOTE — Progress Notes (Signed)
Subjective:    Patient ID: [name], female    DOB: 1975-12-29, 47 y.o.   MRN: [id number]  HPI Virtual Visit via Video Note  I connected with the patient on 07/14/23 at  2:00 PM EDT by a video enabled telemedicine application and verified that I am speaking with the correct person using two identifiers.  Location patient: home Location provider:work or home office Persons participating in the virtual visit: patient, provider  I discussed the limitations of evaluation and management by telemedicine and the availability of in person appointments. The patient expressed understanding and agreed to proceed.   HPI: Here to follow up on anxiety and insomnia. At our last visit we increased the Buspar to 10 mg BID and she tried Temazepam 30 mg for sleep. Her anxiety is now under much better control, and she feels 100% better than before. She still has trouble sleeping however.    ROS: See pertinent positives and negatives per HPI.  Past Medical History:  Diagnosis Date   History of gestational hypertension 2008   Pelvic pain in female    Psoriasis    followed by dr Elmon Else---    Seasonal allergies    Sinus infection    08-01-2017 started antibiotic   Wears contact lenses     Past Surgical History:  Procedure Laterality Date   CESAREAN SECTION  06-02-2007   dr Arelia Sneddon  [facility]   DILATION AND EVACUATION  12-16-2005    dr Arelia Sneddon  [facility]   IM NAILING FEMORAL SHAFT FRACTURE Left 2001   LAPAROSCOPY N/A 08/11/2017   Procedure: LAPAROSCOPY DIAGNOSTIC;  Surgeon: Richardean Chimera, MD;  Location: [facility];  Service: Gynecology;  Laterality: N/A;    History reviewed. No pertinent family history.   Current Outpatient Medications:    Adalimumab (HUMIRA PEN [place]), Inject 1 pen into the skin. Every other week , Disp: , Rfl:    busPIRone (BUSPAR) 5 MG tablet, Take 2 tablets (10 mg total) by mouth 2 (two) times daily., Disp: , Rfl:    cetirizine (ZYRTEC) 10 MG tablet, Take 10 mg  at bedtime by mouth., Disp: , Rfl:    desonide (DESOWEN) 0.05 % cream, Apply topically as needed., Disp: , Rfl:    LORazepam (ATIVAN) 0.5 MG tablet, Take 1 tablet (0.5 mg total) by mouth every 8 (eight) hours as needed for anxiety., Disp: 90 tablet, Rfl: 5   Norethin Ace-Eth Estrad-FE 1-20 MG-MCG(24) CAPS, Take 1 tablet by mouth daily., Disp: , Rfl:    temazepam (RESTORIL) 30 MG capsule, Take 1 capsule (30 mg total) by mouth at bedtime as needed for sleep., Disp: 30 capsule, Rfl: 5  EXAM:  VITALS per patient if applicable:  GENERAL: alert, oriented, appears well and in no acute distress  HEENT: atraumatic, conjunttiva clear, no obvious abnormalities on inspection of external nose and ears  NECK: normal movements of the head and neck  LUNGS: on inspection no signs of respiratory distress, breathing rate appears normal, no obvious gross SOB, gasping or wheezing  CV: no obvious cyanosis  MS: moves all visible extremities without noticeable abnormality  PSYCH/NEURO: pleasant and cooperative, no obvious depression or anxiety, speech and thought processing grossly intact  ASSESSMENT AND PLAN: Her anxiety is under good control now, so she will stay on Buspar 10 mg BID. For sleep, we will stop Temazepam and try Zolpidem 10 mg at bedtime. Recheck in 2 weeks.  Gershon Crane, MD  Discussed the following assessment and plan:  No  diagnosis found.     I discussed the assessment and treatment plan with the patient. The patient was provided an opportunity to ask questions and all were answered. The patient agreed with the plan and demonstrated an understanding of the instructions.   The patient was advised to call back or seek an in-person evaluation if the symptoms worsen or if the condition fails to improve as anticipated.      Review of Systems     Objective:   Physical Exam        Assessment & Plan:

## 2023-07-22 ENCOUNTER — Encounter: Payer: Self-pay | Admitting: Adult Health

## 2023-07-22 ENCOUNTER — Ambulatory Visit: Payer: BC Managed Care – PPO | Admitting: Adult Health

## 2023-07-22 VITALS — BP 100/80 | HR 103 | Temp 98.3°F | Ht 65.25 in | Wt 208.0 lb

## 2023-07-22 DIAGNOSIS — T7840XA Allergy, unspecified, initial encounter: Secondary | ICD-10-CM

## 2023-07-22 DIAGNOSIS — J0141 Acute recurrent pansinusitis: Secondary | ICD-10-CM

## 2023-07-22 MED ORDER — DOXYCYCLINE HYCLATE 100 MG PO CAPS
100.0000 mg | ORAL_CAPSULE | Freq: Two times a day (BID) | ORAL | 0 refills | Status: DC
Start: 2023-07-22 — End: 2023-08-01

## 2023-07-22 MED ORDER — METHYLPREDNISOLONE 4 MG PO TBPK
ORAL_TABLET | ORAL | 0 refills | Status: DC
Start: 2023-07-22 — End: 2023-08-01

## 2023-07-22 NOTE — Progress Notes (Signed)
Subjective:    Patient ID: [name], female    DOB: May 09, 1976, 47 y.o.   MRN: [id number]  HPI  47 year old female who  has a past medical history of History of gestational hypertension (2008), Pelvic pain in female, Psoriasis, Seasonal allergies, Sinus infection, and Wears contact lenses.  She is a patient of Dr. Clent Ridges who I am seeing today for an acute issue. She was seen two days ago at [facility] and was prescribed Augmentin for an acute sinus infection. Yesterday she developed a red rash on her face, legs, chest, and arms. She tried taking Benadyl but this did not help. She did not take any additional Augmentin after the third tab. Today she reports that the rash has improved but still present. Her sinusitis symptoms are still present which include sinus pain/pressure, rhinorrhea, and feeling ill.    Review of Systems See HPI   Past Medical History:  Diagnosis Date   History of gestational hypertension 2008   Pelvic pain in female    Psoriasis    followed by dr Elmon Else---    Seasonal allergies    Sinus infection    08-01-2017 started antibiotic   Wears contact lenses     Social History   Socioeconomic History   Marital status: Married    Spouse name: Not on file   Number of children: Not on file   Years of education: Not on file   Highest education level: Master's degree (e.g., MA, MS, MEng, MEd, MSW, MBA)  Occupational History   Not on file  Tobacco Use   Smoking status: Never   Smokeless tobacco: Never  Vaping Use   Vaping status: Never Used  Substance and Sexual Activity   Alcohol use: Yes    Comment: rare   Drug use: No   Sexual activity: Not on file  Other Topics Concern   Not on file  Social History Narrative   Not on file   Social Determinants of Health   Financial Resource Strain: Low Risk  (07/14/2023)   Overall Financial Resource Strain (CARDIA)    Difficulty of Paying Living Expenses: Not hard at all  Food Insecurity: No Food  Insecurity (07/14/2023)   Hunger Vital Sign    Worried About Running Out of Food in the Last Year: Never true    Ran Out of Food in the Last Year: Never true  Transportation Needs: No Transportation Needs (07/14/2023)   PRAPARE - Administrator, Civil Service (Medical): No    Lack of Transportation (Non-Medical): No  Physical Activity: Insufficiently Active (07/14/2023)   Exercise Vital Sign    Days of Exercise per Week: 3 days    Minutes of Exercise per Session: 30 min  Stress: Stress Concern Present (07/14/2023)   [employer] of Occupational Health - Occupational Stress Questionnaire    Feeling of Stress : Very much  Social Connections: Socially Integrated (07/14/2023)   Social Connection and Isolation Panel [NHANES]    Frequency of Communication with Friends and Family: Three times a week    Frequency of Social Gatherings with Friends and Family: Once a week    Attends Religious Services: More than 4 times per year    Active Member of [employer] or Organizations: No    Attends Engineer, structural: 1 to 4 times per year    Marital Status: Married  Catering manager Violence: Not on file    Past Surgical History:  Procedure Laterality Date  CESAREAN SECTION  06-02-2007   dr Arelia Sneddon  [facility]   DILATION AND EVACUATION  12-16-2005    dr Arelia Sneddon  [facility]   IM NAILING FEMORAL SHAFT FRACTURE Left 2001   LAPAROSCOPY N/A 08/11/2017   Procedure: LAPAROSCOPY DIAGNOSTIC;  Surgeon: Richardean Chimera, MD;  Location: [facility];  Service: Gynecology;  Laterality: N/A;    History reviewed. No pertinent family history.  No Known Allergies  Current Outpatient Medications on File Prior to Visit  Medication Sig Dispense Refill   Adalimumab (HUMIRA PEN [place]) Inject 1 pen into the skin. Every other week      busPIRone (BUSPAR) 10 MG tablet Take 1 tablet (10 mg total) by mouth 2 (two) times daily. 60 tablet 5   cetirizine (ZYRTEC) 10 MG tablet Take 10 mg at bedtime by mouth.      desonide (DESOWEN) 0.05 % cream Apply topically as needed.     LORazepam (ATIVAN) 0.5 MG tablet Take 1 tablet (0.5 mg total) by mouth every 8 (eight) hours as needed for anxiety. 90 tablet 5   Norethin Ace-Eth Estrad-FE 1-20 MG-MCG(24) CAPS Take 1 tablet by mouth daily.     zolpidem (AMBIEN) 10 MG tablet Take 1 tablet (10 mg total) by mouth at bedtime as needed for sleep. 30 tablet 3   No current facility-administered medications on file prior to visit.    BP 100/80   Pulse (!) 103   Temp 98.3 F (36.8 C) (Oral)   Ht 5' 5.25" (1.657 m)   Wt 208 lb (94.3 kg)   LMP 06/27/2023 (Exact Date)   SpO2 98%   BMI 34.35 kg/m       Objective:   Physical Exam Vitals and nursing note reviewed.  Constitutional:      Appearance: Normal appearance.  Cardiovascular:     Rate and Rhythm: Normal rate and regular rhythm.     Pulses: Normal pulses.     Heart sounds: Normal heart sounds.  Pulmonary:     Effort: Pulmonary effort is normal.     Breath sounds: Normal breath sounds.  Skin:    General: Skin is warm and dry.     Findings: Erythema present.     Comments: Red rash noted to her face. She has patches of erythema noted on chest and legs.   Neurological:     General: No focal deficit present.     Mental Status: She is alert and oriented to person, place, and time.  Psychiatric:        Mood and Affect: Mood normal.        Behavior: Behavior normal.        Thought Content: Thought content normal.        Judgment: Judgment normal.        Assessment & Plan:  1. Allergic reaction to drug, initial encounter - Allergy to Augmentin added to chart.  - methylPREDNISolone (MEDROL DOSEPAK) 4 MG TBPK tablet; Take as directed  Dispense: 21 tablet; Refill: 0  2. Acute recurrent pansinusitis  - doxycycline (VIBRAMYCIN) 100 MG capsule; Take 1 capsule (100 mg total) by mouth 2 (two) times daily.  Dispense: 14 capsule; Refill: 0 - Follow up with PCP if not improving   Shirline Frees,  NP  Time spent with patient today was 31 minutes which consisted of chart review, discussing allergic reaction and sinusitis, work up, treatment answering questions and documentation.

## 2023-07-28 ENCOUNTER — Telehealth: Payer: BC Managed Care – PPO | Admitting: Family Medicine

## 2023-07-28 ENCOUNTER — Ambulatory Visit: Payer: BC Managed Care – PPO | Admitting: Psychology

## 2023-07-28 DIAGNOSIS — F418 Other specified anxiety disorders: Secondary | ICD-10-CM

## 2023-07-28 NOTE — Progress Notes (Signed)
[facility] Counselor/Therapist Progress Note  Patient ID: [name], MRN: [id number]   Date: 07/28/23  Time Spent: 3:30  pm - 4:14 pm : 44 Minutes  Treatment Type: Individual Therapy.  Reported Symptoms: depression and anxiety.   Mental Status Exam: Appearance:  Casual     Behavior: Appropriate  Motor: Normal  Speech/Language:  Pressured  Affect: Appropriate  Mood: normal  Thought process: normal  Thought content:   WNL  Sensory/Perceptual disturbances:   WNL  Orientation: oriented to person, place, time/date, and situation  Attention: Good  Concentration: Good  Memory: WNL  Fund of knowledge:  Good  Insight:   Good  Judgment:  Good  Impulse Control: Good   Risk Assessment: Danger to Self:  No Self-injurious Behavior: No Danger to Others: No Duty to Warn:no Physical Aggression / Violence:No  Access to Firearms a concern: No  Gang Involvement:No   Subjective:   [name] participated from the office with the therapist and consented to treatment. She the events of the past week. [name] noted feeling better during the past two weeks. She noted working on eating healthier and getting more fit. She noted feeling motivated in this area. She noted working on being flexible with her diet and noted enjoying some sweets, at a lower frequency, without overdoing it and not punishing self for enjoying it, either. She noted working on taking breaks at work and taking her break consistently. She noted today being the first day in the past two weeks of not taking a break. She noted feeling better when she takes her lunch break regularly. Therapist praised [name] for her effort in this area and encouraged [name] to continue in these areas consistently. She noted working on differentiating the differentiate of being appreciated vs being valued by others and noting this being a topic of conversation with her husband. We processed this during the session. She  noted some progress in her daughter's mood in the past two weeks and noted being able to maintain boundaries in relation to being supportive without being overwhelmed by her daughter's emotions. Therapist praised [name] for her effort between session. Therapist validated [name]'s feelings and experience and provided supportive therapy. A follow-up was scheduled for continued treatment.   Diagnosis:  Anxiety with depression  Intervention: CBT  Psychiatric Treatment: Yes , via PCP. Please see chart.   Treatment Plan:  Client Abilities/Strengths Tonesha is intelligent, self-aware, and motivated for change.   Support System: Family and friends.   Client Treatment Preferences OPT   Client Statement of Needs [name] would like to identifying areas of control and lack of control, managing symptoms, processing past events, boundaries for self and others (not absorbing people's emotional state), managing stressors reactively and proactively, improving distress tolerance, managing negative self-talk and expectations for self.   Treatment Level Weekly  Symptoms  Anxiety: feeling anxious, difficulty managing worry, worrying about different things, trouble relaxing, trouble relaxing, restlessness, irritability.   (Status: maintained) Depression: loss of interest, poor sleep, lethargy, feeling bad about self.    (Status: maintained)  Goals:   [name] has symptoms of depression and depression.   Treatment plan signed and available on s-drive:  No, pending signature.    Target Date: 03/30/24 Frequency: Weekly  Progress: 0 Modality: individual    Therapist will provide referrals for additional resources as appropriate.  Therapist will provide psycho-education regarding [name]'s diagnosis and corresponding treatment approaches and interventions. Licensed Clinical Social Worker, [place], LCSW will support the patient's ability to  achieve the goals identified. will employ CBT,  BA, Problem-solving, Solution Focused, Mindfulness,  coping skills, & other evidenced-based practices will be used to promote progress towards healthy functioning to help manage decrease symptoms associated with her diagnosis.   Reduce overall level, frequency, and intensity of the feelings of depression, anxiety and panic evidenced by decreased overall symptoms from 6 to 7 days/week to 0 to 1 days/week per client report for at least 3 consecutive months. Verbally express understanding of the relationship between feelings of depression, anxiety and their impact on thinking patterns and behaviors. Verbalize an understanding of the role that distorted thinking plays in creating fears, excessive worry, and ruminations.   [name] participated in the creation of the treatment plan)   Meagan Ovens, LCSW

## 2023-08-01 ENCOUNTER — Encounter: Payer: Self-pay | Admitting: Family Medicine

## 2023-08-01 ENCOUNTER — Ambulatory Visit: Payer: BC Managed Care – PPO | Admitting: Family Medicine

## 2023-08-01 ENCOUNTER — Ambulatory Visit (INDEPENDENT_AMBULATORY_CARE_PROVIDER_SITE_OTHER): Payer: BC Managed Care – PPO | Admitting: Family Medicine

## 2023-08-01 VITALS — BP 100/74 | HR 95 | Temp 98.1°F | Wt 206.0 lb

## 2023-08-01 DIAGNOSIS — M722 Plantar fascial fibromatosis: Secondary | ICD-10-CM

## 2023-08-01 NOTE — Progress Notes (Signed)
   Subjective:    Patient ID: [name], female    DOB: [birth year], 47 y.o.   MRN: [id number]  HPI Here for 3 days of pain along the outside of the right foot and under the heel. No recent trauma. She is on her feet a lot for her job. She has tried warm soaks with a little relief. Today it feels better.    Review of Systems  Constitutional: Negative.   Respiratory: Negative.    Cardiovascular: Negative.   Musculoskeletal:  Positive for arthralgias.       Objective:   Physical Exam Constitutional:      Appearance: Normal appearance.  Cardiovascular:     Rate and Rhythm: Normal rate and regular rhythm.     Pulses: Normal pulses.     Heart sounds: Normal heart sounds.  Pulmonary:     Effort: Pulmonary effort is normal.     Breath sounds: Normal breath sounds.  Musculoskeletal:     Comments: The right foot is slightly tender along the lateral edge. No swelling. Both arches are quite flat.   Neurological:     Mental Status: She is alert.           Assessment & Plan:  Plantar fasciitis. This is precipitated by her flat arches. She will use arch support inserts in her shoes. Apply ice several times a day. Stay off her feet this weekend. Use Ibuprofen as needed.  Gershon Crane, MD

## 2023-08-05 ENCOUNTER — Other Ambulatory Visit: Payer: Self-pay | Admitting: Family Medicine

## 2023-08-11 ENCOUNTER — Ambulatory Visit: Payer: BC Managed Care – PPO | Admitting: Psychology

## 2023-08-11 ENCOUNTER — Telehealth: Payer: BC Managed Care – PPO | Admitting: Family Medicine

## 2023-08-13 ENCOUNTER — Telehealth: Payer: BC Managed Care – PPO | Admitting: Family Medicine

## 2023-08-13 ENCOUNTER — Encounter: Payer: Self-pay | Admitting: Family Medicine

## 2023-08-13 DIAGNOSIS — G4700 Insomnia, unspecified: Secondary | ICD-10-CM

## 2023-08-13 DIAGNOSIS — F418 Other specified anxiety disorders: Secondary | ICD-10-CM

## 2023-08-13 NOTE — Progress Notes (Signed)
Subjective:    Patient ID: [name], female    DOB: 11-Jul-1976, 47 y.o.   MRN: [id number]  HPI Virtual Visit via Video Note  I connected with the patient on 08/13/23 at  2:30 PM EST by a video enabled telemedicine application and verified that I am speaking with the correct person using two identifiers.  Location patient: home Location provider:work or home office Persons participating in the virtual visit: patient, provider  I discussed the limitations of evaluation and management by telemedicine and the availability of in person appointments. The patient expressed understanding and agreed to proceed.   HPI: Here to discuss her medications. She has been taking Buspar 10 mg BID and this has been very helpful for her daytime anxiety. Her sleep has been the problem. At our last visit we gave her Zolpidem 10 mg to try. She took this at bedtime a few times, and it dod not seem to work, she tossed and turned all night. Then last night she did not take this at bedtime but instead she took it at 2 am this morning when she had awakened as usual. She then feel asleep about 30 minutes later and she slept the rest of the night. She asks if this is okay to continue.    ROS: See pertinent positives and negatives per HPI.  Past Medical History:  Diagnosis Date   History of gestational hypertension 2008   Pelvic pain in female    Psoriasis    followed by dr Elmon Else---    Seasonal allergies    Sinus infection    08-01-2017 started antibiotic   Wears contact lenses     Past Surgical History:  Procedure Laterality Date   CESAREAN SECTION  06-02-2007   dr Arelia Sneddon  [facility]   DILATION AND EVACUATION  12-16-2005    dr Arelia Sneddon  [facility]   IM NAILING FEMORAL SHAFT FRACTURE Left 2001   LAPAROSCOPY N/A 08/11/2017   Procedure: LAPAROSCOPY DIAGNOSTIC;  Surgeon: Richardean Chimera, MD;  Location: [facility];  Service: Gynecology;  Laterality: N/A;    History reviewed. No pertinent family  history.   Current Outpatient Medications:    Adalimumab (HUMIRA PEN [place]), Inject 1 pen into the skin. Every other week , Disp: , Rfl:    busPIRone (BUSPAR) 10 MG tablet, TAKE 1 TABLET BY MOUTH TWICE A DAY, Disp: 180 tablet, Rfl: 0   cetirizine (ZYRTEC) 10 MG tablet, Take 10 mg at bedtime by mouth., Disp: , Rfl:    desonide (DESOWEN) 0.05 % cream, Apply topically as needed., Disp: , Rfl:    LORazepam (ATIVAN) 0.5 MG tablet, Take 1 tablet (0.5 mg total) by mouth every 8 (eight) hours as needed for anxiety., Disp: 90 tablet, Rfl: 5   Norethin Ace-Eth Estrad-FE 1-20 MG-MCG(24) CAPS, Take 1 tablet by mouth daily., Disp: , Rfl:    zolpidem (AMBIEN) 10 MG tablet, Take 1 tablet (10 mg total) by mouth at bedtime as needed for sleep., Disp: 30 tablet, Rfl: 3  EXAM:  VITALS per patient if applicable:  GENERAL: alert, oriented, appears well and in no acute distress  HEENT: atraumatic, conjunttiva clear, no obvious abnormalities on inspection of external nose and ears  NECK: normal movements of the head and neck  LUNGS: on inspection no signs of respiratory distress, breathing rate appears normal, no obvious gross SOB, gasping or wheezing  CV: no obvious cyanosis  MS: moves all visible extremities without noticeable abnormality  PSYCH/NEURO: pleasant and cooperative, no  obvious depression or anxiety, speech and thought processing grossly intact  ASSESSMENT AND PLAN: Anxiety and insomnia. I told her that waiting to take the Zolpidem in the middle of the night was perfectly fine to do so long as she doesn't feel hung over the next morning. She will continue to take it this way. Gershon Crane, MD  Discussed the following assessment and plan:  No diagnosis found.     I discussed the assessment and treatment plan with the patient. The patient was provided an opportunity to ask questions and all were answered. The patient agreed with the plan and demonstrated an understanding of the  instructions.   The patient was advised to call back or seek an in-person evaluation if the symptoms worsen or if the condition fails to improve as anticipated.      Review of Systems     Objective:   Physical Exam        Assessment & Plan:

## 2023-08-25 ENCOUNTER — Ambulatory Visit: Payer: BC Managed Care – PPO | Admitting: Psychology

## 2023-08-25 DIAGNOSIS — F418 Other specified anxiety disorders: Secondary | ICD-10-CM

## 2023-08-25 NOTE — Progress Notes (Signed)
[facility] Counselor/Therapist Progress Note  Patient ID: [name], MRN: [id number]   Date: 08/25/23  Time Spent: 3:11  pm - 4:15 pm : 64 Minutes  Treatment Type: Individual Therapy.  Reported Symptoms: depression and anxiety.   Mental Status Exam: Appearance:  Casual     Behavior: Appropriate  Motor: Normal  Speech/Language:  Pressured  Affect: Tearful  Mood: dysthymic  Thought process: normal  Thought content:   WNL  Sensory/Perceptual disturbances:   WNL  Orientation: oriented to person, place, time/date, and situation  Attention: Good  Concentration: Good  Memory: WNL  Fund of knowledge:  Good  Insight:   Good  Judgment:  Good  Impulse Control: Good   Risk Assessment: Danger to Self:  No Self-injurious Behavior: No Danger to Others: No Duty to Warn:no Physical Aggression / Violence:No  Access to Firearms a concern: No  Gang Involvement:No   Subjective:   [name] participated from the office with the therapist and consented to treatment. She the events of the past week. [name] noted feeling better during the past two weeks. She noted that she "jinxed" herself. She noted health related stressors for her daughter that were unexpected. She noted her daughter being mistreated and noted that she "losing my shit" at the medical provider. She noted that this "brought out all the anger". She was tearful during the session. She noted that "I don't know how to help her". She noted that her husband does not express his feelings regarding their daughter's trauma and noted a need for communication and understanding regarding this. She noted feelings of guilt. We worked on processing this during the session. She noted work related stressors and an expensive repair to the new car exacerbating her stress level. We processed this during the session and her feelings regarding these stressors. [name] worked on Research scientist (physical sciences) positives during the session.  Therapist praised [name] for this during the session. We worked on identifying ways to [employer] and noted the importance of managing rumination. [name] was engaged and motivated during the session. We scheduled a follow-up for continued treatment. Therapist validated [name]'s feelings and experience. Therapist provided supportive therapy.    Diagnosis:  Anxiety with depression  Intervention: CBT  Psychiatric Treatment: Yes , via PCP. Please see chart.   Treatment Plan:  Client Abilities/Strengths [name] is intelligent, self-aware, and motivated for change.   Support System: Family and friends.   Client Treatment Preferences OPT   Client Statement of Needs [name] would like to identifying areas of control and lack of control, managing symptoms, processing past events, boundaries for self and others (not absorbing people's emotional state), managing stressors reactively and proactively, improving distress tolerance, managing negative self-talk and expectations for self.   Treatment Level Weekly  Symptoms  Anxiety: feeling anxious, difficulty managing worry, worrying about different things, trouble relaxing, trouble relaxing, restlessness, irritability.   (Status: maintained) Depression: loss of interest, poor sleep, lethargy, feeling bad about self.    (Status: maintained)  Goals:   [name] has symptoms of depression and depression.   Treatment plan signed and available on s-drive:  No, pending signature.    Target Date: 03/30/24 Frequency: Weekly  Progress: 0 Modality: individual    Therapist will provide referrals for additional resources as appropriate.  Therapist will provide psycho-education regarding [name]'s diagnosis and corresponding treatment approaches and interventions. Licensed Clinical Social Worker, [place], LCSW will support the patient's ability to achieve the goals identified. will employ CBT, BA, Problem-solving, Solution Focused,  Mindfulness,  coping skills, & other evidenced-based practices will be used to promote progress towards healthy functioning to help manage decrease symptoms associated with her diagnosis.   Reduce overall level, frequency, and intensity of the feelings of depression, anxiety and panic evidenced by decreased overall symptoms from 6 to 7 days/week to 0 to 1 days/week per client report for at least 3 consecutive months. Verbally express understanding of the relationship between feelings of depression, anxiety and their impact on thinking patterns and behaviors. Verbalize an understanding of the role that distorted thinking plays in creating fears, excessive worry, and ruminations.   [name] participated in the creation of the treatment plan)   [name], LCSW

## 2023-09-25 ENCOUNTER — Telehealth: Payer: BC Managed Care – PPO | Admitting: Family Medicine

## 2023-10-07 ENCOUNTER — Ambulatory Visit: Payer: 59 | Admitting: Psychology

## 2023-10-07 DIAGNOSIS — F418 Other specified anxiety disorders: Secondary | ICD-10-CM | POA: Diagnosis not present

## 2023-10-07 NOTE — Progress Notes (Signed)
 [facility] Counselor/Therapist Progress Note  Patient ID: [name], MRN: [id number]   Date: 10/07/23  Time Spent: 4:05 pm - 4:58 pm :  53 Minutes  Treatment Type: Individual Therapy.  Reported Symptoms: depression and anxiety.   Mental Status Exam: Appearance:  Casual     Behavior: Appropriate  Motor: Normal  Speech/Language:  Pressured  Affect: Tearful  Mood: dysthymic  Thought process: normal  Thought content:   WNL  Sensory/Perceptual disturbances:   WNL  Orientation: oriented to person, place, time/date, and situation  Attention: Good  Concentration: Good  Memory: WNL  Fund of knowledge:  Good  Insight:   Good  Judgment:  Good  Impulse Control: Good   Risk Assessment: Danger to Self:  No Self-injurious Behavior: No Danger to Others: No Duty to Warn:no Physical Aggression / Violence:No  Access to Firearms a concern: No  Gang Involvement:No   Subjective:   [name] participated from the office with the therapist and consented to treatment. She the events of the past week. [name] noted feeling better during the past two weeks. She noted having a positive overall christmas break but noted  her anxiety being high but could not identify any contributing factors to this. She noted waking up at night and feeling a general sense of anxiety. She denied an elevated heart rate or heart racing. She noted this occurring a couple of times. We processed this and possible contributing factors. She noted her daughter's recent car accident and the stressors related to the financial costs. She noted frustration regarding how anxiety affects her life, at times. She noted previously attempting to plan a trip and noted this resulting in her feeling anxious. She noted her PCP noted that she is also experiencing a depressed mood, which she endorsed. She noted negative self-talk and highlighted this during the session. She noted her efforts to manage her  negative self-talk in the moment including I don't want to do it, in relation to an outing, but engaging none the less. We worked on highlighting her negative self-talk, challenging this, making choices that support the mood she would like to be in and gauging how she might feel now and later. Therapist modeled this during the session. Therapist provided psycho-education regarding self-talk, thoughts, feelings, and behavior. Therapist validated [name]'s feelings and experience and provided supportive therapy. A follow-up was scheduled for continued treatment which [name] benefits from.   Diagnosis:  Anxiety with depression  Intervention: CBT  Psychiatric Treatment: Yes , via PCP. Please see chart.   Treatment Plan:  Client Abilities/Strengths [name] is intelligent, self-aware, and motivated for change.   Support System: Family and friends.   Client Treatment Preferences OPT   Client Statement of Needs [name] would like to identifying areas of control and lack of control, managing symptoms, processing past events, boundaries for self and others (not absorbing people's emotional state), managing stressors reactively and proactively, improving distress tolerance, managing negative self-talk and expectations for self.   Treatment Level Weekly  Symptoms  Anxiety: feeling anxious, difficulty managing worry, worrying about different things, trouble relaxing, trouble relaxing, restlessness, irritability.   (Status: maintained) Depression: loss of interest, poor sleep, lethargy, feeling bad about self.    (Status: maintained)  Goals:   [name] has symptoms of depression and depression.   Treatment plan signed and available on s-drive:  No, pending signature.    Target Date: 03/30/24 Frequency: Weekly  Progress: 0 Modality: individual    Therapist will provide referrals for additional  resources as appropriate.  Therapist will provide psycho-education regarding [name]'s  diagnosis and corresponding treatment approaches and interventions. Licensed Clinical Social Worker, [place], LCSW will support the patient's ability to achieve the goals identified. will employ CBT, BA, Problem-solving, Solution Focused, Mindfulness,  coping skills, & other evidenced-based practices will be used to promote progress towards healthy functioning to help manage decrease symptoms associated with her diagnosis.   Reduce overall level, frequency, and intensity of the feelings of depression, anxiety and panic evidenced by decreased overall symptoms from 6 to 7 days/week to 0 to 1 days/week per client report for at least 3 consecutive months. Verbally express understanding of the relationship between feelings of depression, anxiety and their impact on thinking patterns and behaviors. Verbalize an understanding of the role that distorted thinking plays in creating fears, excessive worry, and ruminations.   [name] participated in the creation of the treatment plan)   Meagan Mullet, LCSW

## 2023-11-04 ENCOUNTER — Ambulatory Visit: Payer: 59 | Admitting: Psychology

## 2023-11-04 DIAGNOSIS — F418 Other specified anxiety disorders: Secondary | ICD-10-CM

## 2023-11-04 NOTE — Progress Notes (Signed)
 [facility] Counselor/Therapist Progress Note  Patient ID: HUDSON LEHMKUHL, MRN: [id number]   Date: 11/04/23  Time Spent: 4:03 pm - 4:56 pm :  53 Minutes  Treatment Type: Individual Therapy.  Reported Symptoms: depression and anxiety.   Mental Status Exam: Appearance:  Casual     Behavior: Appropriate  Motor: Normal  Speech/Language:  Pressured  Affect: Tearful  Mood: dysthymic  Thought process: normal  Thought content:   WNL  Sensory/Perceptual disturbances:   WNL  Orientation: oriented to person, place, time/date, and situation  Attention: Good  Concentration: Good  Memory: WNL  Fund of knowledge:  Good  Insight:   Good  Judgment:  Good  Impulse Control: Good   Risk Assessment: Danger to Self:  No Self-injurious Behavior: No Danger to Others: No Duty to Warn:no Physical Aggression / Violence:No  Access to Firearms a concern: No  Gang Involvement:No   Subjective:   Delon LITTIE Sprang participated from the home and consented to treatment. Therapist participated from office. She the events of the past week. She noted positives recently. She noted communicating her feelings more consistently and noted receiving empathy without people fixing things. She noted a possible exciting but challenging professional transition. She noted a new position being created and her possible transition and noted having to learn to let go other job responsibilities and letting go of that role and her effort to manage and control this area. She noted a need to temper her expectations until further development occurs. She noted a need to be mindful of her circle of influence. We discussed the importance of check-ins, managing distress, managing rumination, be open to feedback. She noted progress in her daughter's mental health and noted working on adopting a more big picture perspective. Therapist praised Janaia for her effort during the session and between sessions. Wyvonna was  receptive and engaged during the session. She expressed commitment towards goals. Therapist praised Randall for the effort during the session and provided supportive therapy. A follow-up was scheduled for continued treatment which she benefits from.    Diagnosis:  Anxiety with depression  Intervention: CBT  Psychiatric Treatment: Yes , via PCP. Please see chart.   Treatment Plan:  Client Abilities/Strengths Elbony is intelligent, self-aware, and motivated for change.   Support System: Family and friends.   Client Treatment Preferences OPT   Client Statement of Needs Maureen would like to identifying areas of control and lack of control, managing symptoms, processing past events, boundaries for self and others (not absorbing people's emotional state), managing stressors reactively and proactively, improving distress tolerance, managing negative self-talk and expectations for self.   Treatment Level Weekly  Symptoms  Anxiety: feeling anxious, difficulty managing worry, worrying about different things, trouble relaxing, trouble relaxing, restlessness, irritability.   (Status: maintained) Depression: loss of interest, poor sleep, lethargy, feeling bad about self.    (Status: maintained)  Goals:   Terica has symptoms of depression and depression.   Treatment plan signed and available on s-drive:  No, pending signature.    Target Date: 03/30/24 Frequency: Weekly  Progress: 0 Modality: individual    Therapist will provide referrals for additional resources as appropriate.  Therapist will provide psycho-education regarding Normajean's diagnosis and corresponding treatment approaches and interventions. Licensed Clinical Social Worker, [place], LCSW will support the patient's ability to achieve the goals identified. will employ CBT, BA, Problem-solving, Solution Focused, Mindfulness,  coping skills, & other evidenced-based practices will be used to promote progress towards  healthy functioning to help  manage decrease symptoms associated with her diagnosis.   Reduce overall level, frequency, and intensity of the feelings of depression, anxiety and panic evidenced by decreased overall symptoms from 6 to 7 days/week to 0 to 1 days/week per client report for at least 3 consecutive months. Verbally express understanding of the relationship between feelings of depression, anxiety and their impact on thinking patterns and behaviors. Verbalize an understanding of the role that distorted thinking plays in creating fears, excessive worry, and ruminations.   [name] participated in the creation of the treatment plan)   [name], LCSW

## 2023-11-05 ENCOUNTER — Other Ambulatory Visit: Payer: Self-pay | Admitting: Family Medicine

## 2023-11-25 ENCOUNTER — Ambulatory Visit: Payer: 59 | Admitting: Psychology

## 2023-11-25 DIAGNOSIS — F418 Other specified anxiety disorders: Secondary | ICD-10-CM | POA: Diagnosis not present
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
## 2023-12-13 ENCOUNTER — Other Ambulatory Visit: Payer: Self-pay | Admitting: Family Medicine

## 2023-12-15 ENCOUNTER — Ambulatory Visit (INDEPENDENT_AMBULATORY_CARE_PROVIDER_SITE_OTHER): Payer: 59 | Admitting: Psychology

## 2023-12-15 DIAGNOSIS — F418 Other specified anxiety disorders: Secondary | ICD-10-CM
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
## 2023-12-15 NOTE — Telephone Encounter (Signed)
 08/13/2023 VV  05/13/2023 Fill fate  90/5 refills

## 2024-01-10 ENCOUNTER — Other Ambulatory Visit: Payer: Self-pay | Admitting: Family Medicine

## 2024-01-19 ENCOUNTER — Ambulatory Visit (INDEPENDENT_AMBULATORY_CARE_PROVIDER_SITE_OTHER): Payer: 59 | Admitting: Psychology

## 2024-01-19 DIAGNOSIS — F418 Other specified anxiety disorders: Secondary | ICD-10-CM

## 2024-01-19 NOTE — Progress Notes (Signed)
 [facility] Counselor/Therapist Progress Note  Patient ID: [name], MRN: [id number]   Date: 01/19/24  Time Spent: 3:00 pm - 4:01 pm : 61 Minutes  Treatment Type: Individual Therapy.  Reported Symptoms: depression and anxiety.   Mental Status Exam: Appearance:  Casual     Behavior: Appropriate  Motor: Normal  Speech/Language:  Normal Rate  Affect: Congruent  Mood: anxious  Thought process: normal  Thought content:   WNL  Sensory/Perceptual disturbances:   WNL  Orientation: oriented to person, place, time/date, and situation  Attention: Good  Concentration: Good  Memory: WNL  Fund of knowledge:  Good  Insight:   Good  Judgment:  Good  Impulse Control: Good   Risk Assessment: Danger to Self:  No Self-injurious Behavior: No Danger to Others: No Duty to Warn:no Physical Aggression / Violence:No  Access to Firearms a concern: No  Gang Involvement:No   Subjective:   [name] participated from the the office, with the patient, and consented to treatment. She noted the events of the past week. [name] noted a follow-up conversation with her sister that quickly devolved in her sister being defensive and antagonistic. [name] noted her attempts to be diplomatic with her sister which was met with continued antagonism. She noted this resulting in [name] in blocking her sister across all methods of communication and noted her interest in reconciling, if her sister is able to communicate respectfully. She noted her sister reaching out via a forgotten communication app and [name], noted not reading the message and blocking her there, as well. She noted feeling both a sense of relief and sadness. She also noted a sense of guilt despite not wanting to "take it". She noted having empathy for her sister's experience but noted frustration regarding particular stances her sister has regarding parenting. She noted a need to "protect my mental and emotional  health". Therapist praised [name] for this during the session. She noted a need to reduce her digital consumption and noted a need to reduce her cell phone use and generally be more mindful of this use. She noted a need to create a new bedtime routine after the realization that she spends 2 hours at night right before bedtime. She identified mindfulness exercises, yoga classes on the TV, reading her kindle, and daily reflections. Therapist praised [name] for her mindfulness regarding needs and phone usage. Therapist reviewed dopamine detox during the session and provided handouts, vie email, for reference and review. Therapist validated [name]'s feelings and experience during the session. Therapist praised [name] for her effort during the session. A follow-up was scheduled for continued treatment.   Diagnosis:  Anxiety with depression  Intervention: CBT & interpersonal.  Psychiatric Treatment: Yes , via PCP. Please see chart.   Treatment Plan:  [name] is intelligent, self-aware, and motivated for change.   Support System: Family and friends.   Meagan Treatment Preferences OPT   Meagan Statement of Needs [name] would like to identifying areas of control and lack of control, managing symptoms, processing past events, boundaries for self and others (not absorbing people's emotional state), managing stressors reactively and proactively, improving distress tolerance, managing negative self-talk and expectations for self.   Treatment Level Weekly  Symptoms  Anxiety: feeling anxious, difficulty managing worry, worrying about different things, trouble relaxing, trouble relaxing, restlessness, irritability.   (Status: maintained) Depression: loss of interest, poor sleep, lethargy, feeling bad about self.    (Status: maintained)  Goals:   [name] has symptoms of depression and  depression.   Treatment plan signed and available on s-drive:  No, pending  signature.    Target Date: 03/30/24 Frequency: Weekly  Progress: 25% Modality: individual    Therapist will provide referrals for additional resources as appropriate.  Therapist will provide psycho-education regarding [name]'s diagnosis and corresponding treatment approaches and interventions. Licensed Clinical Social Worker, [place], LCSW will support the patient's ability to achieve the goals identified. will employ CBT, BA, Problem-solving, Solution Focused, Mindfulness,  coping skills, & other evidenced-based practices will be used to promote progress towards healthy functioning to help manage decrease symptoms associated with her diagnosis.   Reduce overall level, frequency, and intensity of the feelings of depression, anxiety and panic evidenced by decreased overall symptoms from 6 to 7 days/week to 0 to 1 days/week per Meagan report for at least 3 consecutive months. Verbally express understanding of the relationship between feelings of depression, anxiety and their impact on thinking patterns and behaviors. Verbalize an understanding of the role that distorted thinking plays in creating fears, excessive worry, and ruminations.   [name] participated in the creation of the treatment plan)   Belva Boyden, LCSW

## 2024-02-07 ENCOUNTER — Other Ambulatory Visit: Payer: Self-pay | Admitting: Family Medicine

## 2024-02-17 ENCOUNTER — Ambulatory Visit (INDEPENDENT_AMBULATORY_CARE_PROVIDER_SITE_OTHER): Admitting: Psychology

## 2024-02-17 DIAGNOSIS — F418 Other specified anxiety disorders: Secondary | ICD-10-CM | POA: Diagnosis not present

## 2024-02-17 NOTE — Progress Notes (Signed)
 [facility] Counselor/Therapist Progress Note  Patient ID: [name], MRN: [id number]   Date: 02/17/24  Time Spent: 3:55 pm - 4:53 pm : 58 Minutes  Treatment Type: Individual Therapy.  Reported Symptoms: depression and anxiety.   Mental Status Exam: Appearance:  Casual     Behavior: Appropriate  Motor: Normal  Speech/Language:  Normal Rate  Affect: Congruent  Mood: anxious  Thought process: normal  Thought content:   WNL  Sensory/Perceptual disturbances:   WNL  Orientation: oriented to person, place, time/date, and situation  Attention: Good  Concentration: Good  Memory: WNL  Fund of knowledge:  Good  Insight:   Good  Judgment:  Good  Impulse Control: Good   Risk Assessment: Danger to Self:  No Self-injurious Behavior: No Danger to Others: No Duty to Warn:no Physical Aggression / Violence:No  Access to Firearms a concern: No  Gang Involvement:No   Subjective:   [name] participated from the the office, with the patient, and consented to treatment. She noted the events of the past week. [name] noted her continued attempts to disconnect from her sister due to her antagonism. She noted that the rest of the family continuing to maintain contact with [name]'s system. She noted her father recent reaching out for a "fact finding mission" to get everyone's perspective. She noted that various family members choosing sides including her 47 year old nephew who claimed that he will be engaging in a hunger strike. Separately, she noted her mother in-law's escalating alcoholism and noted the most recent visit resulting in [name] experiencing significant anxiety which resulted in somatic complaints. She noted the need to set boundaries for her mental health with all family members who are causing distress and not just some. She noted this resulting in declining to attend the planned two week trip to her mother in-law's. Therapist praised [name] for  her effort to set boundaries, meet her own needs, and prioritize her mental health. She noted her efforts to manage the familial stressors while maintaining boundaries and not inadvertently engaging in area's that could result in distress for her. She noted having sympathy for her sister despite her sister's antagonism and approach. She noted continuing to walk up at 5:30 to walk the dog with her husband and noted feeling an improvement in mood and physiological symptoms of anxiety. She noted reducing her phone usage by 50%. Therapist praised [name] for her effort to engage in self-care and encouraged continued effort in this area. She noted her goal on staying busy while engaging in self-care throughout the summer break. Therapist praised [name] &  encouraged continued effort in this area. Therapist validated [name]'s feelings and experience and provided supportive therapy. A follow-up was scheduled for continued treatment.    Diagnosis:  Anxiety with depression  Intervention: CBT & interpersonal.  Psychiatric Treatment: Yes , via PCP. Please see chart.   Treatment Plan:  Client Abilities/Strengths [name] is intelligent, self-aware, and motivated for change.   Support System: Family and friends.   Client Treatment Preferences OPT   Client Statement of Needs Kimmerly would like to identifying areas of control and lack of control, managing symptoms, processing past events, boundaries for self and others (not absorbing people's emotional state), managing stressors reactively and proactively, improving distress tolerance, managing negative self-talk and expectations for self.   Treatment Level Weekly  Symptoms  Anxiety: feeling anxious, difficulty managing worry, worrying about different things, trouble relaxing, trouble relaxing, restlessness, irritability.   (Status: maintained) Depression: loss of interest,  poor sleep, lethargy, feeling bad about self.    (Status: maintained)  Goals:    [name] has symptoms of depression and depression.   Treatment plan signed and available on s-drive:  No, pending signature.    Target Date: 03/30/24 Frequency: Weekly  Progress: 25% Modality: individual    Therapist will provide referrals for additional resources as appropriate.  Therapist will provide psycho-education regarding [name]'s diagnosis and corresponding treatment approaches and interventions. Licensed Clinical Social Worker, [place], LCSW will support the patient's ability to achieve the goals identified. will employ CBT, BA, Problem-solving, Solution Focused, Mindfulness,  coping skills, & other evidenced-based practices will be used to promote progress towards healthy functioning to help manage decrease symptoms associated with her diagnosis.   Reduce overall level, frequency, and intensity of the feelings of depression, anxiety and panic evidenced by decreased overall symptoms from 6 to 7 days/week to 0 to 1 days/week per client report for at least 3 consecutive months. Verbally express understanding of the relationship between feelings of depression, anxiety and their impact on thinking patterns and behaviors. Verbalize an understanding of the role that distorted thinking plays in creating fears, excessive worry, and ruminations.   [name] participated in the creation of the treatment plan)   Meagan Boyden, LCSW

## 2024-03-22 ENCOUNTER — Ambulatory Visit (INDEPENDENT_AMBULATORY_CARE_PROVIDER_SITE_OTHER): Admitting: Psychology

## 2024-03-22 DIAGNOSIS — F418 Other specified anxiety disorders: Secondary | ICD-10-CM | POA: Diagnosis not present

## 2024-03-22 NOTE — Progress Notes (Signed)
 [facility] Counselor/Therapist Progress Note  Patient ID: ASHANI PUMPHREY, MRN: [id number]   Date: 03/22/24  Time Spent: 10:03 am - 11:01 am : 58 Minutes  Treatment Type: Individual Therapy.  Reported Symptoms: depression and anxiety.   Mental Status Exam: Appearance:  Casual     Behavior: Appropriate  Motor: Normal  Speech/Language:  Normal Rate  Affect: Congruent  Mood: anxious and dysthymic  Thought process: normal  Thought content:   WNL  Sensory/Perceptual disturbances:   WNL  Orientation: oriented to person, place, time/date, and situation  Attention: Good  Concentration: Good  Memory: WNL  Fund of knowledge:  Good  Insight:   Good  Judgment:  Good  Impulse Control: Good   Risk Assessment: Danger to Self:  No Self-injurious Behavior: No Danger to Others: No Duty to Warn:no Physical Aggression / Violence:No  Access to Firearms a concern: No  Gang Involvement:No   Subjective:    Kaianna participated from the the office, with the patient, and consented to treatment. She noted the events of the past week. She noted her interest in processing her relationship with her husband. She noted stark differences between how they manage stress. She noted that he husband doesn't know how to deal with it (stress) and often avoids his stress until it comes out. She noted this often results in her feeling isolated. We worked on processing this during the session including the effect on their overall relationship. She noted contributing factors to his approach to stress and stress level including growing up in a family that didn't share feelings. She noted that his mother and sister are both alcoholic and his mother's alcoholism is worsening. She noted her attempts to coach him through stressors and noted this being ineffective. She noted a recent argument between them and provided details regarding this interaction.She was tearful during the session.  She noted  her effort to provide to his needs and noted that this isn't reciprocated. We processed this during the session. She noted her husband's decision to engage in counseling but noted being unsure of the timeline for sessions. We worked on identifying boundaries going forwards, reviewed assertive communication, and conflict resolution. Therapist encouraged Gemma to identify additional boundaries, communicate them clearly and assertively, and engage in self-care. Additionally, therapist encouraged Barbar to identity her unmet needs. We will process this going forward. Mylah was receptive during the session and expressed commitment towards goals. Therapist praised Nayelli for her effort and energy during the session. Therapist validated Shariece's feelings and experience and provided supportive therapy. A follow-up was scheduled for continued treatment which she benefits from.   Diagnosis:  Anxiety with depression  Intervention: CBT & interpersonal.  Psychiatric Treatment: Yes , via PCP. Please see chart.   Treatment Plan:  Client Abilities/Strengths Burnett is intelligent, self-aware, and motivated for change.   Support System: Family and friends.   Client Treatment Preferences OPT   Client Statement of Needs Dashanti would like to identifying areas of control and lack of control, managing symptoms, processing past events, boundaries for self and others (not absorbing people's emotional state), managing stressors reactively and proactively, improving distress tolerance, managing negative self-talk and expectations for self.   Treatment Level Weekly  Symptoms  Anxiety: feeling anxious, difficulty managing worry, worrying about different things, trouble relaxing, trouble relaxing, restlessness, irritability.   (Status: maintained) Depression: loss of interest, poor sleep, lethargy, feeling bad about self.    (Status: maintained)  Goals:   Josanna has symptoms of depression and  depression.   Treatment plan signed and available on s-drive:  No, pending signature.    Target Date: 03/30/24 Frequency: Weekly  Progress: 25% Modality: individual    Therapist will provide referrals for additional resources as appropriate.  Therapist will provide psycho-education regarding Vanilla's diagnosis and corresponding treatment approaches and interventions. Licensed Clinical Social Worker, [place], LCSW will support the patient's ability to achieve the goals identified. will employ CBT, BA, Problem-solving, Solution Focused, Mindfulness,  coping skills, & other evidenced-based practices will be used to promote progress towards healthy functioning to help manage decrease symptoms associated with her diagnosis.   Reduce overall level, frequency, and intensity of the feelings of depression, anxiety and panic evidenced by decreased overall symptoms from 6 to 7 days/week to 0 to 1 days/week per client report for at least 3 consecutive months. Verbally express understanding of the relationship between feelings of depression, anxiety and their impact on thinking patterns and behaviors. Verbalize an understanding of the role that distorted thinking plays in creating fears, excessive worry, and ruminations.   [name] participated in the creation of the treatment plan)   [name], LCSW

## 2024-04-19 ENCOUNTER — Encounter: Payer: Self-pay | Admitting: Family Medicine

## 2024-04-26 ENCOUNTER — Ambulatory Visit (INDEPENDENT_AMBULATORY_CARE_PROVIDER_SITE_OTHER): Admitting: Psychology

## 2024-04-26 DIAGNOSIS — F418 Other specified anxiety disorders: Secondary | ICD-10-CM

## 2024-04-26 NOTE — Progress Notes (Signed)
 [name] Counselor/Therapist Progress Note  Patient ID: [name], MRN: [id number]   Date: 04/26/24  Time Spent: 10:02 am - 11:00 am : 58 Minutes  Treatment Type: Individual Therapy.  Reported Symptoms: depression and anxiety.   Mental Status Exam: Appearance:  Casual     Behavior: Appropriate  Motor: Normal  Speech/Language:  Normal Rate  Affect: Congruent  Mood: anxious and dysthymic  Thought process: normal  Thought content:   WNL  Sensory/Perceptual disturbances:   WNL  Orientation: oriented to person, place, time/date, and situation  Attention: Good  Concentration: Good  Memory: WNL  Fund of knowledge:  Good  Insight:   Good  Judgment:  Good  Impulse Control: Good   Risk Assessment: Danger to Self:  No Self-injurious Behavior: No Danger to Others: No Duty to Warn:no Physical Aggression / Violence:No  Access to Firearms a concern: No  Gang Involvement:No   Subjective:   [name] participated from the the office, with the patient, and consented to treatment. She noted the events of the past week. [name] noted recently unblocking her sister after her sister inquired about her via their brother. She noted this resulting in [name] and her sister speaking over the phone. She noted her effort in discussing her frustration about their interactions, advocating for self, and being assertive. She noted this going well, in the moment, but later receiving feedback from her niece with her sister's perspective regarding the call which as much different than [name]'s. She noted the struggle to identify how to carry on a relationship with her sister while maintaining boundaries and assertiveness. We worked on exploring this during the session. She noted her husband's recent initiation of individual therapy and noted her positive feelings regarding this decision. She noted her expectations for change and noted that her expectations, regarding timelines, are  unrealistic. We worked on exploring this during the session. She noted feeling some of her frustration coming out regarding little things. She noted setting boundaries with her husband and family regarding household tasks, maintaining self-responsibilities, and addressing various family dynamics. She noted her daughter's progress in therapy and being invited to attend a session with her daughter that will include further disclosure of trauma. We worked on the importance of maintaining perspective regarding her husband's therapy, setting reasonable expectations for progress, and being mindful of feelings and frustrations and communicating this positively and adaptively. We practiced this during the session. [name] noted feeling triggered when people verbally commit to a task and not follow through. We worked on processing this and will continue to going forward. [name] was engaged and motivated during the session. She expressed commitment towards goals. Therapist praised [name] for her effort and energy and provided supportive therapy. A follow-up was scheduled for continued treatment, which she benefits from.   Diagnosis:  Anxiety with depression  Intervention: CBT & interpersonal.  Psychiatric Treatment: Yes , via PCP. Please see chart.   Treatment Plan:  Client Abilities/Strengths [name] is intelligent, self-aware, and motivated for change.   Support System: Family and friends.   Client Treatment Preferences OPT   Client Statement of Needs [name] would like to identifying areas of control and lack of control, managing symptoms, processing past events, boundaries for self and others (not absorbing people's emotional state), managing stressors reactively and proactively, improving distress tolerance, managing negative self-talk and expectations for self.   Treatment Level Weekly  Symptoms  Anxiety: feeling anxious, difficulty managing worry, worrying about different things,  trouble relaxing, trouble relaxing, restlessness, irritability.   (  Status: maintained) Depression: loss of interest, poor sleep, lethargy, feeling bad about self.    (Status: maintained)  Goals:   [name] has symptoms of depression and depression.   Treatment plan signed and available on s-drive:  No, pending signature.    Target Date: 04/30/24 Frequency: Weekly  Progress: 25% Modality: individual    Therapist will provide referrals for additional resources as appropriate.  Therapist will provide psycho-education regarding [name]'s diagnosis and corresponding treatment approaches and interventions. Licensed Clinical Social Worker, [place], LCSW will support the patient's ability to achieve the goals identified. will employ CBT, BA, Problem-solving, Solution Focused, Mindfulness,  coping skills, & other evidenced-based practices will be used to promote progress towards healthy functioning to help manage decrease symptoms associated with her diagnosis.   Reduce overall level, frequency, and intensity of the feelings of depression, anxiety and panic evidenced by decreased overall symptoms from 6 to 7 days/week to 0 to 1 days/week per client report for at least 3 consecutive months. Verbally express understanding of the relationship between feelings of depression, anxiety and their impact on thinking patterns and behaviors. Verbalize an understanding of the role that distorted thinking plays in creating fears, excessive worry, and ruminations.   [name] participated in the creation of the treatment plan)   Meagan Mullet, LCSW

## 2024-05-07 ENCOUNTER — Other Ambulatory Visit: Payer: Self-pay | Admitting: Family Medicine

## 2024-05-17 ENCOUNTER — Ambulatory Visit (INDEPENDENT_AMBULATORY_CARE_PROVIDER_SITE_OTHER): Admitting: Psychology

## 2024-05-17 DIAGNOSIS — F418 Other specified anxiety disorders: Secondary | ICD-10-CM

## 2024-05-17 NOTE — Progress Notes (Signed)
 [facility] Counselor/Therapist Progress Note  Patient ID: TAMAIYA BUMP, MRN: [id number]   Date: 05/17/24  Time Spent: 3:31 pm - 4:28 pm :56 Minutes  Treatment Type: Individual Therapy.  Reported Symptoms: depression and anxiety.   Mental Status Exam: Appearance:  Casual     Behavior: Appropriate  Motor: Normal  Speech/Language:  Normal Rate  Affect: Congruent  Mood: anxious and dysthymic  Thought process: normal  Thought content:   WNL  Sensory/Perceptual disturbances:   WNL  Orientation: oriented to person, place, time/date, and situation  Attention: Good  Concentration: Good  Memory: WNL  Fund of knowledge:  Good  Insight:   Good  Judgment:  Good  Impulse Control: Good   Risk Assessment: Danger to Self:  No Self-injurious Behavior: No Danger to Others: No Duty to Warn:no Physical Aggression / Violence:No  Access to Firearms a concern: No  Gang Involvement:No   Subjective:   Mardi participated from the the office, with the patient, and consented to treatment. She noted the events of the past week. She noted really struggling with anxiety and not sleeping. She noted her effort to work on maintaining a sleep log. She noted being prescribed a sleep aid. She noted often waking up at ~ 2 am & having difficulty falling a sleep for ~ 2 hours prior being able to go back to sleep. She noted her various efforts to improve sleep via various sleep hygiene tools, to no avail. She noted later realizing that her sleep is disturbed due to night-time rumination and increased anxiety. She noted reading a book regarding sleep and rumination and noted learning about the acronym RAIN which involves relaxing, acceptance of feelings, investigate with kindness, nurture non-identification (feelings are temporary that will pass). She noted her poor sleep has been mentally and physically exhausting and greatly affected by her anxiety. We reviewed sleep hygiene.  Therapist  highlighted possible contributing factors contributing to her poor sleep including negative-self talk, negative framing, and  behaviors that could lead to a further awakened state such as phone use. Her self-talk includes Maybe I can go back to sleep, maybe not and often grabbing the phone. After I have woken up myself fully.  We worked on identifying possible concern that lead to her anxiety. We worked shifting self-talk, language, and reframing. We practiced this during the session. Therapist praised Aubrii for her reframing during the session. Therapist encouraged Johnell to work on language, self-talk, framing, reminding self of positives, and normalized feelings during transitions while proactively managing them. Allan was engaged and motivated during the session. She expressed commitment towards goals.Therapist provided supportive therapy. A follow-up was scheduled for continued treatment which she benefits from.    Diagnosis:  Anxiety with depression  Intervention: CBT & interpersonal.  Psychiatric Treatment: Yes , via PCP. Please see chart.   Treatment Plan:  Client Abilities/Strengths Kiely is intelligent, self-aware, and motivated for change.   Support System: Family and friends.   Client Treatment Preferences OPT   Client Statement of Needs Signa would like to identifying areas of control and lack of control, managing symptoms, processing past events, boundaries for self and others (not absorbing people's emotional state), managing stressors reactively and proactively, improving distress tolerance, managing negative self-talk and expectations for self.   Treatment Level Weekly  Symptoms  Anxiety: feeling anxious, difficulty managing worry, worrying about different things, trouble relaxing, trouble relaxing, restlessness, irritability.   (Status: declined) Depression: loss of interest, poor sleep, lethargy, feeling bad about self.    (  Status:  maintained)  Goals:   Marcy has symptoms of depression and depression.   Treatment plan signed and available on s-drive:  No, pending signature.    Target Date: 05/18/24 Frequency: Weekly  Progress: 25% Modality: individual    Therapist will provide referrals for additional resources as appropriate.  Therapist will provide psycho-education regarding Dearia's diagnosis and corresponding treatment approaches and interventions. Licensed Clinical Social Worker, [place], LCSW will support the patient's ability to achieve the goals identified. will employ CBT, BA, Problem-solving, Solution Focused, Mindfulness,  coping skills, & other evidenced-based practices will be used to promote progress towards healthy functioning to help manage decrease symptoms associated with her diagnosis.   Reduce overall level, frequency, and intensity of the feelings of depression, anxiety and panic evidenced by decreased overall symptoms from 6 to 7 days/week to 0 to 1 days/week per client report for at least 3 consecutive months. Verbally express understanding of the relationship between feelings of depression, anxiety and their impact on thinking patterns and behaviors. Verbalize an understanding of the role that distorted thinking plays in creating fears, excessive worry, and ruminations.   [name] participated in the creation of the treatment plan)   [name], LCSW

## 2024-06-14 ENCOUNTER — Ambulatory Visit (INDEPENDENT_AMBULATORY_CARE_PROVIDER_SITE_OTHER): Admitting: Psychology

## 2024-06-14 DIAGNOSIS — F418 Other specified anxiety disorders: Secondary | ICD-10-CM | POA: Diagnosis not present

## 2024-06-14 NOTE — Progress Notes (Unsigned)
 [facility] Counselor Initial Adult Exam  Name: [name] Date: 06/14/2024 MRN: [id number] DOB: [birth year] PCP: Johnny Garnette LABOR, MD  Time Spent: 3:35 pm - 4:29   pm : 54 Minutes  Guardian/Payee:  self    Paperwork requested: No   Reason for Visit /Presenting Problem: depression and anxiety.   Mental Status Exam: Appearance:   Casual     Behavior:  Appropriate  Motor:  Normal  Speech/Language:   Clear and Coherent  Affect:  Appropriate  Mood:  dysthymic  Thought process:  normal  Thought content:    WNL  Sensory/Perceptual disturbances:    WNL  Orientation:  oriented to person, place, time/date, and situation  Attention:  Good  Concentration:  Good  Memory:  WNL  Fund of knowledge:   Good  Insight:    Good  Judgment:   Good  Impulse Control:  Good   Reported Symptoms:  depression and anxiety.   Risk Assessment: Danger to Self:  No Self-injurious Behavior: No Danger to Others: No Duty to Warn:no Physical Aggression / Violence:No  Access to Firearms a concern: No  Gang Involvement:No  Patient / guardian was educated about steps to take if suicide or homicide risk level increases between visits: n/a While future psychiatric events cannot be accurately predicted, the patient does not currently require acute inpatient psychiatric care and does not currently meet [place]  involuntary commitment criteria.  Substance Abuse History: Current substance abuse: No     Caffeine: 1 coke zero daily.  Tobacco: denied.  Alcohol: rarely. (~1 per year) Substance use: denied.   Past Psychiatric History:   No previous psychological problems have been observed Outpatient Providers: Therapy after foster placements left the home. Did free counseling via work, in [employer], and did not pick it back up after school restarted.  History of Psych Hospitalization: No  Psychological Testing: NA  Family history: suspected niece and sister (undiagnosed).    Abuse History:  Victim of: No., na   Report needed: No. Victim of Neglect:No. Perpetrator of na  Witness / Exposure to Domestic Violence: No   Protective Services Involvement: No  Witness to [employer] Violence:  No   Family History: No family history on file.  Living situation: the patient lives with their family  Sexual Orientation: Straight  Relationship Status: married  Name of spouse / other: Sidra (24 years) If a parent, number of children / ages: Asberry (59) and Darlyn (19)  (adopted out of foster-care after fostering).   Support Systems: spouse Friends Administrator, arts & Gena) sister  Financial Stress:  No , no current stressors.   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   Military Service: No   Educational History:. Education: Masters in Engineer, manufacturing  Religion/Sprituality/World View: Christian  Any cultural differences that may affect / interfere with treatment:  not applicable   Recreation/Hobbies: Traveling and reading (Mystery, Thrillers, Novels, historical fiction, and biographies.  Stressors: Other: New position at work, changes in family dynamics, her daughter's impending graduation in June, 2027     Strengths: Supportive Relationships, Family, Friends, Spirituality, Hopefulness, Self Advocate, consistency in therapy, and Able to Communicate Effectively  Barriers:  Na   Legal History: Pending legal issue / charges: The patient has no significant history of legal issues. History of legal issue / charges: denied.   Medical History/Surgical History: reviewed Past Medical History:  Diagnosis Date   History of gestational hypertension 2008   Pelvic pain in female  Psoriasis    followed by dr darice reusing---    Seasonal allergies    Sinus infection    08-01-2017 started antibiotic   Wears contact lenses     Past Surgical History:  Procedure Laterality Date   CESAREAN SECTION   06-02-2007   dr leva  [facility]   DILATION AND EVACUATION  12-16-2005    dr leva  [facility]   IM NAILING FEMORAL SHAFT FRACTURE Left 2001   LAPAROSCOPY N/A 08/11/2017   Procedure: LAPAROSCOPY DIAGNOSTIC;  Surgeon: leva Rush, MD;  Location: [facility];  Service: Gynecology;  Laterality: N/A;    Medications: Current Outpatient Medications  Medication Sig Dispense Refill   Adalimumab (HUMIRA PEN [place]) Inject 1 pen into the skin. Every other week      busPIRone  (BUSPAR ) 10 MG tablet TAKE 1 TABLET BY MOUTH TWICE A DAY 180 tablet 3   cetirizine (ZYRTEC) 10 MG tablet Take 10 mg at bedtime by mouth.     desonide (DESOWEN) 0.05 % cream Apply topically as needed.     LORazepam  (ATIVAN ) 0.5 MG tablet TAKE 1 TABLET BY MOUTH EVERY 8 HOURS AS NEEDED FOR ANXIETY. 90 tablet 5   Norethin Ace-Eth Estrad-FE 1-20 MG-MCG(24) CAPS Take 1 tablet by mouth daily.     zolpidem  (AMBIEN ) 10 MG tablet TAKE 1 TABLET BY MOUTH AT BEDTIME AS NEEDED FOR SLEEP. 30 tablet 5   No current facility-administered medications for this visit.    Allergies  Allergen Reactions   Augmentin  [Amoxicillin -Pot Clavulanate] Rash    Diagnoses:  Anxiety with depression  Psychiatric Treatment: Yes , by PCP, Dr. Johnny. Please see chart.   Plan of Care: Outpatient therapy.   Narrative:  [name] participated from office with therapist and consented to treatment. We reviewed the limits of confidentiality prior to the start of the evaluation. [name] expressed understanding and agreement to proceed. She was initially referred by her PCP, Dr. Johnny. This is her annual reassessment. She has not had an annual physical in the past year and is slated to contact her provider to get this appointment scheduled but noted possible barriers being insurance barriers. She is currently being prescribed Buspar  5 mg BID and Ativan  .5mg  PRN. She noted that she has used her Ativan  significantly less frequently in the past few  months. She noted taking Ambien  for sleep and noted that her sleep is terrible still. She noted it being up and down. She noted some contributing factors including various household projects that have required the relocation of various rooms in the process. Additional stressors include work related stressor which introduced the intersection of academics and politics. She current stressors include work related stressors with her new position, her daughter spending more time out of the home, her college career beginning fall 2027. She noted the counter product of this is working towards finding social support that is as outgoing and active as she and her daughter were together. She noted her daughter doing well overall and discussed that she was invited to her one her daughter's session to hear her trauma narrative. She noted being able to handle this most positively than she expected. Her anxiety symptoms include feeling anxious, difficulty managing worry, worrying about different things, trouble relaxing, restlessness, irritability. Her depressive symptoms difficulty with sleep and lethargy. She noted work transition of beginning a new position and noted some feelings of guilt due to her overall reduced workload. She noted things going well. She has been participating in  counseling consistently and has attended regularly. She would benefit from continued treatment to process past events, bolster coping skills, verbalizing thoughts and feelings, and proactively managing her mood and stressors. Additionally, she noted a need to learn relax, not stay busy, and manage the distress of not being perpetually in motion often doing 2+ activities at the same time. She presented as intelligent, self-aware, and motivated for change. A follow-up was scheduled to complete a treatment plan and continued treatment.    GAD-7: 16 PHQ-9: 5  [name], LCSW

## 2024-07-12 ENCOUNTER — Ambulatory Visit (INDEPENDENT_AMBULATORY_CARE_PROVIDER_SITE_OTHER): Admitting: Psychology

## 2024-07-12 DIAGNOSIS — F418 Other specified anxiety disorders: Secondary | ICD-10-CM

## 2024-07-12 NOTE — Progress Notes (Unsigned)
 [facility] Counselor/Therapist Progress Note  Patient ID: [name], MRN: [id number]    Date: 07/12/24  Time Spent: 3:28  pm - 4:28 pm : 60 Minutes  Treatment Type: Individual Therapy.  Reported Symptoms: Anxiety  Mental Status Exam: Appearance:  Casual     Behavior: Appropriate  Motor: Normal  Speech/Language:  Clear and Coherent  Affect: Congruent  Mood: normal  Thought process: normal  Thought content:   WNL  Sensory/Perceptual disturbances:   WNL  Orientation: oriented to person, place, time/date, and situation  Attention: Good  Concentration: Good  Memory: WNL  Fund of knowledge:  Good  Insight:   Good  Judgment:  Good  Impulse Control: Good   Risk Assessment: Danger to Self:  No Self-injurious Behavior: No Danger to Others: No Duty to Warn:no Physical Aggression / Violence:No  Access to Firearms a concern: No  Gang Involvement:No   Subjective:   [name] participated in the session, in person in the office with the therapist, and consented to treatment [name] reviewed the events of the past week. We reviewed numerous treatment approaches including CBT, BA, Problem Solving, and Solution focused therapy. Psych-education regarding the [name]'s diagnosis of Anxiety with depression was provided during the session. We discussed [name]'s goals treatment goals which include to continue her efforts to manage distress, maintain boundaries for self and others, develop ability to support others without taking on their stress, managing self-talk. She noted a continued drive to address her stressors proactively and setting reasonable expectations for self. Additional goals is to be more present, minimize multi-tasking, develop comfort with taking break, process relationship with her father, and managing distress of not being productive throughout the week and weekend. [name] provided verbal approval of the treatment plan.  She noted her continued effort to take short breaks and not to feel pressure to always be productive.   She noted her father's recent hospitalization and she noted this being a reminder that she doesn't have the relationship she wants with him. She noted his hospitalization was not life threatening. She noted hugely different stances with her father, in regards to politics, and noted this being a significant strain. She stated that he recently told her that she has no moral compass due to their antithetical stances. She noted that her father does not allow for opinions or stances different than his own. She noted that her father does not actually know her stances on various issues. She noted her attempts to put aside political stances and focus on maintaining a positive relationship outside of this topic but noted this being unsuccessful. We will continue to process this going forward.    Interventions: Psycho-education & Goal Setting.   Diagnosis:   Anxiety with depression  Psychiatric Treatment: Yes , via PCP.   Treatment Plan:  Client Abilities/Strengths [name] intelligent, self-aware, and motivated for change.   Support System: Family and friends.   Client Treatment Preferences OPT  Client Statement of Needs [name] would like to continue her efforts to manage distress, maintain boundaries for self and others (work and home), develop ability to support others without taking on their stress, managing self-talk. She noted a continued drive to address her stressors proactively and setting reasonable expectations for self. Additional goals is to be more present, minimize multi-tasking, develop comfort with taking breaks and managing distress of not being productive throughout the week and weekend. She noted a need to process her relationship with her father.  Treatment Level Weekly  Symptoms  Anxiety: feeling anxious, difficulty managing worry, worrying about different  things, trouble relaxing, restlessness, and irritability.   (Status: maintained) Depression:   middle insomnia, lethargy. (Status: maintained)  Goals:   [name] experiences symptoms of anxiety.   Treatment plan signed and available on s-drive:  No, pending signature via MyChart.  [name] was sent the treatment plan signature form on 07/12/24.   Target Date: 07/12/25 Frequency: Weekly  Progress: 0 Modality: individual    Therapist will provide referrals for additional resources as appropriate.  Therapist will provide psycho-education regarding [name] diagnosis and corresponding treatment approaches and interventions. Meagan Mullet, LCSW will support the patient's ability to achieve the goals identified. will employ CBT, BA, Problem-solving, Solution Focused, Mindfulness,  coping skills, & other evidenced-based practices will be used to promote progress towards healthy functioning to help manage decrease symptoms associated with her diagnosis.   Reduce overall level, frequency, and intensity of the feelings of depression, anxiety and panic evidenced by decreased overall symptoms from 6 to 7 days/week to 0 to 1 days/week per client report for at least 3 consecutive months. Verbally express understanding of the relationship between feelings of depression, anxiety and their impact on thinking patterns and behaviors. Verbalize an understanding of the role that distorted thinking plays in creating fears, excessive worry, and ruminations.  [name] participated in the creation of the treatment plan)    Meagan Mullet, LCSW

## 2024-07-13 ENCOUNTER — Encounter: Payer: Self-pay | Admitting: Psychology

## 2024-07-27 ENCOUNTER — Ambulatory Visit: Payer: Self-pay | Admitting: Family Medicine

## 2024-07-27 ENCOUNTER — Encounter: Payer: Self-pay | Admitting: Family Medicine

## 2024-07-27 ENCOUNTER — Ambulatory Visit: Admitting: Family Medicine

## 2024-07-27 VITALS — BP 110/72 | HR 91 | Temp 98.1°F | Ht 65.5 in | Wt 204.0 lb

## 2024-07-27 DIAGNOSIS — Z0000 Encounter for general adult medical examination without abnormal findings: Secondary | ICD-10-CM | POA: Diagnosis not present

## 2024-07-27 DIAGNOSIS — R3121 Asymptomatic microscopic hematuria: Secondary | ICD-10-CM | POA: Diagnosis not present

## 2024-07-27 LAB — CBC WITH DIFFERENTIAL/PLATELET
Basophils Absolute: 0 K/uL (ref 0.0–0.1)
Basophils Relative: 0.5 % (ref 0.0–3.0)
Eosinophils Absolute: 0.3 K/uL (ref 0.0–0.7)
Eosinophils Relative: 3.1 % (ref 0.0–5.0)
HCT: 43.6 % (ref 36.0–46.0)
Hemoglobin: 14.9 g/dL (ref 12.0–15.0)
Lymphocytes Relative: 28.3 % (ref 12.0–46.0)
Lymphs Abs: 2.4 K/uL (ref 0.7–4.0)
MCHC: 34.3 g/dL (ref 30.0–36.0)
MCV: 92 fl (ref 78.0–100.0)
Monocytes Absolute: 0.9 K/uL (ref 0.1–1.0)
Monocytes Relative: 10.6 % (ref 3.0–12.0)
Neutro Abs: 4.8 K/uL (ref 1.4–7.7)
Neutrophils Relative %: 57.5 % (ref 43.0–77.0)
Platelets: 297 K/uL (ref 150.0–400.0)
RBC: 4.74 Mil/uL (ref 3.87–5.11)
RDW: 12.5 % (ref 11.5–15.5)
WBC: 8.3 K/uL (ref 4.0–10.5)

## 2024-07-27 LAB — URINALYSIS, ROUTINE W REFLEX MICROSCOPIC
Bilirubin Urine: NEGATIVE
Ketones, ur: NEGATIVE
Nitrite: NEGATIVE
Specific Gravity, Urine: 1.025 (ref 1.000–1.030)
Total Protein, Urine: NEGATIVE
Urine Glucose: NEGATIVE
Urobilinogen, UA: 0.2 (ref 0.0–1.0)
pH: 6 (ref 5.0–8.0)

## 2024-07-27 LAB — BASIC METABOLIC PANEL WITH GFR
BUN: 13 mg/dL (ref 6–23)
CO2: 24 meq/L (ref 19–32)
Calcium: 9 mg/dL (ref 8.4–10.5)
Chloride: 103 meq/L (ref 96–112)
Creatinine, Ser: 0.68 mg/dL (ref 0.40–1.20)
GFR: 103.54 mL/min (ref >60.00)
Glucose, Bld: 96 mg/dL (ref 70–99)
Potassium: 4.3 meq/L (ref 3.5–5.1)
Sodium: 136 meq/L (ref 135–145)

## 2024-07-27 LAB — LIPID PANEL
Cholesterol: 141 mg/dL (ref 0–200)
HDL: 46.5 mg/dL (ref >39.00)
LDL Cholesterol: 62 mg/dL (ref 0–99)
NonHDL: 94.03
Total CHOL/HDL Ratio: 3
Triglycerides: 158 mg/dL — ABNORMAL HIGH (ref 0.0–149.0)
VLDL: 31.6 mg/dL (ref 0.0–40.0)

## 2024-07-27 LAB — TSH: TSH: 2 u[IU]/mL (ref 0.35–5.50)

## 2024-07-27 LAB — HEPATIC FUNCTION PANEL
ALT: 16 U/L (ref 0–35)
AST: 14 U/L (ref 0–37)
Albumin: 4 g/dL (ref 3.5–5.2)
Alkaline Phosphatase: 60 U/L (ref 39–117)
Bilirubin, Direct: 0.1 mg/dL (ref 0.0–0.3)
Total Bilirubin: 0.4 mg/dL (ref 0.2–1.2)
Total Protein: 7.8 g/dL (ref 6.0–8.3)

## 2024-07-27 LAB — HEMOGLOBIN A1C: Hgb A1c MFr Bld: 5.3 % (ref 4.6–6.5)

## 2024-07-27 LAB — VITAMIN B12: Vitamin B-12: 259 pg/mL (ref 211–911)

## 2024-07-27 MED ORDER — BUSPIRONE HCL 10 MG PO TABS: 5.0000 mg | ORAL_TABLET | Freq: Two times a day (BID) | ORAL | Status: AC

## 2024-07-27 NOTE — Progress Notes (Signed)
 Subjective:    Patient ID: [name], female    DOB: 05/08/1976, 48 y.o.   MRN: [id number]  HPI Here for a well exam. She has several issues to discuss. First she began to have numbness or tingling in her feet about 5 months ago. The left foot is more pronounced than the right. Then about one month ago she started having numbness in both hands as well. There is no pain. Her skin never changes colors. Also her anxiety has been under more control lately, and she asks about decreasing her dose of Buspar . She seldom takes Lorazepam  any more. She also describes some increased urine frequency at night, sometimes she goes once or twice when she never used to. There is no urgency or burning.    Review of Systems  Constitutional: Negative.   HENT: Negative.    Eyes: Negative.   Respiratory: Negative.    Cardiovascular: Negative.   Gastrointestinal: Negative.   Genitourinary:  Positive for frequency. Negative for decreased urine volume, difficulty urinating, dyspareunia, dysuria, enuresis, flank pain, hematuria, pelvic pain and urgency.  Musculoskeletal: Negative.   Skin: Negative.   Neurological:  Positive for numbness. Negative for headaches.  Psychiatric/Behavioral: Negative.         Objective:   Physical Exam Constitutional:      General: She is not in acute distress.    Appearance: She is well-developed. She is obese.  HENT:     Head: Normocephalic and atraumatic.     Right Ear: External ear normal.     Left Ear: External ear normal.     Nose: Nose normal.     Mouth/Throat:     Pharynx: No oropharyngeal exudate.  Eyes:     General: No scleral icterus.    Conjunctiva/sclera: Conjunctivae normal.     Pupils: Pupils are equal, round, and reactive to light.  Neck:     Thyroid : No thyromegaly.     Vascular: No JVD.  Cardiovascular:     Rate and Rhythm: Normal rate and regular rhythm.     Pulses: Normal pulses.     Heart sounds: Normal heart sounds. No murmur heard.    No  friction rub. No gallop.  Pulmonary:     Effort: Pulmonary effort is normal. No respiratory distress.     Breath sounds: Normal breath sounds. No wheezing or rales.  Chest:     Chest wall: No tenderness.  Abdominal:     General: Bowel sounds are normal. There is no distension.     Palpations: Abdomen is soft. There is no mass.     Tenderness: There is no abdominal tenderness. There is no guarding or rebound.  Musculoskeletal:        General: No tenderness. Normal range of motion.     Cervical back: Normal range of motion and neck supple.  Lymphadenopathy:     Cervical: No cervical adenopathy.  Skin:    General: Skin is warm and dry.     Findings: No erythema or rash.  Neurological:     General: No focal deficit present.     Mental Status: She is alert and oriented to person, place, and time.     Cranial Nerves: No cranial nerve deficit.     Motor: No abnormal muscle tone.     Coordination: Coordination normal.     Deep Tendon Reflexes: Reflexes are normal and symmetric. Reflexes normal.  Psychiatric:        Mood and Affect: Mood normal.  Behavior: Behavior normal.        Thought Content: Thought content normal.        Judgment: Judgment normal.           Assessment & Plan:  Well exam. We discussed diet and exercise. Get fasting labs. For her anxiety, we will decrease the Buspar  to 1/2 a tablet (5 mg) BID. 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 For the urinary frequency, we will check a UA.  Garnette Olmsted, MD

## 2024-07-27 NOTE — Addendum Note (Signed)
 Addended by: JOHNNY SENIOR A on: 07/27/2024 02:15 PM   Modules accepted: Orders

## 2024-08-04 ENCOUNTER — Telehealth: Payer: Self-pay

## 2024-08-04 NOTE — Telephone Encounter (Signed)
 Copied from CRM #[id number]. Topic: Clinical - Lab/Test Results >> Jul 29, 2024 12:56 PM Rea C wrote: Reason for CRM: Patient called back. Relayed lab results.

## 2024-08-07 ENCOUNTER — Other Ambulatory Visit: Payer: Self-pay | Admitting: Family Medicine

## 2024-08-09 ENCOUNTER — Ambulatory Visit (INDEPENDENT_AMBULATORY_CARE_PROVIDER_SITE_OTHER): Admitting: Psychology

## 2024-08-09 DIAGNOSIS — F418 Other specified anxiety disorders: Secondary | ICD-10-CM | POA: Diagnosis not present

## 2024-08-09 NOTE — Progress Notes (Signed)
  Behavioral Health Counselor/Therapist Progress Note  Patient ID: [name], MRN: [id number]    Date: 08/09/24  Time Spent: 3:37  pm - 4:27 pm : 50 Minutes  Treatment Type: Individual Therapy.  Reported Symptoms: Anxiety  Mental Status Exam: Appearance:  Casual     Behavior: Appropriate  Motor: Normal  Speech/Language:  Clear and Coherent  Affect: Congruent  Mood: anxious  Thought process: normal  Thought content:   WNL  Sensory/Perceptual disturbances:   WNL  Orientation: oriented to person, place, time/date, and situation  Attention: Good  Concentration: Good  Memory: WNL  Fund of knowledge:  Good  Insight:   Good  Judgment:  Good  Impulse Control: Good   Risk Assessment: Danger to Self:  No Self-injurious Behavior: No Danger to Others: No Duty to Warn:no Physical Aggression / Violence:No  Access to Firearms a concern: No  Gang Involvement:No   Subjective:   [name] participated in the session, in person in the office with the therapist, and consented to treatment [name] reviewed the events of the past week. She noted recently reducing her anxiety medication and discussed this being challenging. She noted being hopeful that she does not rely on medication to manage her mood and noted a need to be more proactive with behavioral management tools. She noted recently gaining ~5# and noted a need to be more focus on her intake. She noted her effort to set reasonable and sustainable goals. She noted wanting to build balance in her approach but noted this being daunting despite the support she receives from others. She noted not being used to balance taking care of self while taking care of others. She noted her effort to schedule self-care on the family calendar. She noted her attempts to foster a flexible perspective and attitude. Therapist encouraged setting reasonable and sustainable expectations, delineating goals related to her health aside  of weight (joint health, muscle mass, quality of life as I age), engage in check-ins, and building/maintaining healthy habits. We discussed eliciting resources to aid in this process including a registered dietician. [name] noted a history of meeting with one in the past. Therapist validated [name]'s feelings and experience during the session, encouraged self-care, and provided supportive therapy. A follow-up was scheduled to continue treatment, which she benefits from.   Interventions: CBT  Diagnosis:   Anxiety with depression  Psychiatric Treatment: Yes , via PCP.   Treatment Plan:  Client Abilities/Strengths [name] intelligent, self-aware, and motivated for change.   Support System: Family and friends.   Client Treatment Preferences OPT  Client Statement of Needs [name] would like to continue her efforts to manage distress, maintain boundaries for self and others (work and home), develop ability to support others without taking on their stress, managing self-talk. She noted a continued drive to address her stressors proactively and setting reasonable expectations for self. Additional goals is to be more present, minimize multi-tasking, develop comfort with taking breaks and managing distress of not being productive throughout the week and weekend. She noted a need to process her relationship with her father.   Treatment Level Weekly  Symptoms  Anxiety: feeling anxious, difficulty managing worry, worrying about different things, trouble relaxing, restlessness, and irritability.   (Status: maintained) Depression:   middle insomnia, lethargy. (Status: maintained)  Goals:   [name] experiences symptoms of anxiety.   Treatment plan signed and available on s-drive:  No, pending signature via MyChart.  [name] was sent the treatment plan signature form on 07/12/24.  Target Date: 07/12/25 Frequency: Weekly  Progress: 0 Modality: individual    Therapist will  provide referrals for additional resources as appropriate.  Therapist will provide psycho-education regarding [name]'s diagnosis and corresponding treatment approaches and interventions. Meagan Mullet, LCSW will support the patient's ability to achieve the goals identified. will employ CBT, BA, Problem-solving, Solution Focused, Mindfulness,  coping skills, & other evidenced-based practices will be used to promote progress towards healthy functioning to help manage decrease symptoms associated with her diagnosis.   Reduce overall level, frequency, and intensity of the feelings of depression, anxiety and panic evidenced by decreased overall symptoms from 6 to 7 days/week to 0 to 1 days/week per client report for at least 3 consecutive months. Verbally express understanding of the relationship between feelings of depression, anxiety and their impact on thinking patterns and behaviors. Verbalize an understanding of the role that distorted thinking plays in creating fears, excessive worry, and ruminations.  [name] participated in the creation of the treatment plan)    Meagan Mullet, LCSW

## 2024-08-19 ENCOUNTER — Other Ambulatory Visit: Payer: Self-pay | Admitting: Medical Genetics

## 2024-08-19 ENCOUNTER — Encounter: Payer: Self-pay | Admitting: Family Medicine

## 2024-09-06 ENCOUNTER — Ambulatory Visit: Admitting: Psychology

## 2024-09-06 DIAGNOSIS — F418 Other specified anxiety disorders: Secondary | ICD-10-CM

## 2024-09-06 NOTE — Progress Notes (Signed)
 [facility] Counselor/Therapist Progress Note  Patient ID: [name], MRN: [id number]    Date: 09/06/24  Time Spent: 3:28  pm - 4:16 pm : 48 Minutes  Treatment Type: Individual Therapy.  Reported Symptoms: Anxiety  Mental Status Exam: Appearance:  Casual     Behavior: Appropriate  Motor: Normal  Speech/Language:  Clear and Coherent  Affect: Congruent  Mood: anxious  Thought process: normal  Thought content:   WNL  Sensory/Perceptual disturbances:   WNL  Orientation: oriented to person, place, time/date, and situation  Attention: Good  Concentration: Good  Memory: WNL  Fund of knowledge:  Good  Insight:   Good  Judgment:  Good  Impulse Control: Good   Risk Assessment: Danger to Self:  No Self-injurious Behavior: No Danger to Others: No Duty to Warn:no Physical Aggression / Violence:No  Access to Firearms a concern: No  Gang Involvement:No   Subjective:   [name] participated in the session, from home, via video and and consented to treatment. Therapist participate from office.  [name] reviewed the events of the past week. [name] noted electing not to reduce her anti-depressant dosage due to feeling an increase in her symptoms upon her attempt to reduce her dose. [name] noted recently attending her niece's wedding and noted her sister behaving poorly prior to the wedding. [name] noted the stress related to this for her and the family as a whole. [name] noted her brother recently calling her to relay a disagreement between him and their father. [name] noted her consideration to not attend the family thanksgiving. We worked on processing this during the session. [name] noted her decision to attend and noted the [name] also noted stress due a student bringing a weapon to her husband's school. [name] noted how upsetting this revelation. We worked on processing this during the session. [name] noted various additional stressors with her son's biological mother. [name] noted  having a sense of obligation to family and struggling with this. [name] noted a sense of pride that [name] weight her options and feelings prior to making decisions related to her family and spending time. [name] endorsed rumination during the session. We worked on processing this during the session and we discussed ways to manage rumination including identifying areas of control and lack of control, focusing on the present, reminding self of the cost of what ifs, and redirecting self. Therapist praised [name] for her effort between and during the session. Therapist validated [name]'s feeling and experience during the session and provided supportive therapy. A follow-up was scheduled for continued treatment, which [name] benefits from.    Interventions: CBT & interpersonal  Diagnosis:   No diagnosis found.  Psychiatric Treatment: Yes , via PCP.   Treatment Plan:  Client Abilities/Strengths [name] intelligent, self-aware, and motivated for change.   Support System: Family and friends.   Client Treatment Preferences OPT  Client Statement of Needs [name] would like to continue her efforts to manage distress, maintain boundaries for self and others (work and home), develop ability to support others without taking on their stress, managing self-talk. [name] noted a continued drive to address her stressors proactively and setting reasonable expectations for self. Additional goals is to be more present, minimize multi-tasking, develop comfort with taking breaks and managing distress of not being productive throughout the week and weekend. [name] noted a need to process her relationship with her father.   Treatment Level Weekly  Symptoms  Anxiety: feeling anxious, difficulty managing worry, worrying about different things, trouble relaxing,  restlessness, and irritability.   (Status: maintained) Depression:   middle insomnia, lethargy. (Status: maintained)  Goals:   [name] experiences symptoms  of anxiety.   Treatment plan signed and available on s-drive:  No, pending signature via MyChart.  [name] was sent the treatment plan signature form on 07/12/24.   Target Date: 07/12/25 Frequency: Weekly  Progress: 0 Modality: individual    Therapist will provide referrals for additional resources as appropriate.  Therapist will provide psycho-education regarding Jadelynn's diagnosis and corresponding treatment approaches and interventions. Meagan Mullet, LCSW will support the patient's ability to achieve the goals identified. will employ CBT, BA, Problem-solving, Solution Focused, Mindfulness,  coping skills, & other evidenced-based practices will be used to promote progress towards healthy functioning to help manage decrease symptoms associated with her diagnosis.   Reduce overall level, frequency, and intensity of the feelings of depression, anxiety and panic evidenced by decreased overall symptoms from 6 to 7 days/week to 0 to 1 days/week per client report for at least 3 consecutive months. Verbally express understanding of the relationship between feelings of depression, anxiety and their impact on thinking patterns and behaviors. Verbalize an understanding of the role that distorted thinking plays in creating fears, excessive worry, and ruminations.  [name] participated in the creation of the treatment plan)    Meagan Mullet, LCSW

## 2024-09-16 ENCOUNTER — Other Ambulatory Visit: Payer: Self-pay | Admitting: Family Medicine

## 2024-10-04 ENCOUNTER — Ambulatory Visit (INDEPENDENT_AMBULATORY_CARE_PROVIDER_SITE_OTHER): Admitting: Psychology

## 2024-10-04 DIAGNOSIS — F418 Other specified anxiety disorders: Secondary | ICD-10-CM

## 2024-10-04 NOTE — Progress Notes (Signed)
 [name] Counselor/Therapist Progress Note  Patient ID: [name], MRN: [id number]    Date: 10/04/2024  Time Spent: 3:32  pm - 4:32 pm : 60 Minutes  Treatment Type: Individual Therapy.  Reported Symptoms: Anxiety  Mental Status Exam: Appearance:  Casual     Behavior: Appropriate  Motor: Normal  Speech/Language:  Clear and Coherent  Affect: Congruent  Mood: normal  Thought process: normal  Thought content:   WNL  Sensory/Perceptual disturbances:   WNL  Orientation: oriented to person, place, time/date, and situation  Attention: Good  Concentration: Good  Memory: WNL  Fund of knowledge:  Good  Insight:   Good  Judgment:  Good  Impulse Control: Good   Risk Assessment: Danger to Self:  No Self-injurious Behavior: No Danger to Others: No Duty to Warn:no Physical Aggression / Violence:No  Access to Firearms a concern: No  Gang Involvement:No   Subjective:   [name] participated in the session from the office, with the therapist, and and consented to treatment.  [name] reviewed the events of the past week. She noted her grandmother's recent passing at the age of 29. She noted her MIL's ([name]) greatly insensitive comment about [name]'s grandmother's passing. She provided in-depth dynamics in the family particularly focusing on how her in-laws have treated her over the years. She noted her MIL's ongoing severe alcoholism. [name] highlighted how their children were treated in comparison to the other grandchildren. [name] noted her sister in-law validating [name]'s own experience from the onset of her relationship with her husband by the family. She noted this resulting in [name] thinking I can trust me. She noted being accused, by her biological family, as being overly emotional or sensitive for initially declining to attend the funeral due to a family emergency. She noted her brother being supportive of her decision to stay home  during this time. She noted feeling like she needed permission to skip the funeral, despite her own inclination to make the came choice. She noted this validation driving her to identify additional boundaries, going forward. She noted the difference between her approach and boundaries with her husbands. She noted the likelihood that her husband would be supportive her boundaries. She noted her father recently communicating gratitude for her attending his mother's funeral only to immediately chastise her for being an emotional person who makes terrible decisions. [name] highlighted her fathers hypocritical nature and general critical attitude throughout various interactions. [name] noted her effort to positively view various characteristics of her personality despite the negative feedback she has received, from family, in the past. Therapist praised [name] for her effort during the session to set boundaries, advocate for self, and disengage from antagonism. Therapist encourage [name] to continue her effort with boundaries and being mindful of her needs, despite negative feedback she might receive from others. Therapist validated [name]'s feelings and experience and provided supportive therapy. A follow-up was scheduled for continued treatment, which she benefits from.   Interventions: CBT & interpersonal  Diagnosis:   Anxiety with depression  Psychiatric Treatment: Yes , via PCP.   Treatment Plan:  Client Abilities/Strengths [name] intelligent, self-aware, and motivated for change.   Support System: Family and friends.   Client Treatment Preferences OPT  Client Statement of Needs [name] would like to continue her efforts to manage distress, maintain boundaries for self and others (work and home), develop ability to support others without taking on their stress, managing self-talk. She noted a continued drive to address her stressors proactively and setting  reasonable  expectations for self. Additional goals is to be more present, minimize multi-tasking, develop comfort with taking breaks and managing distress of not being productive throughout the week and weekend. She noted a need to process her relationship with her father.   Treatment Level Weekly  Symptoms  Anxiety: feeling anxious, difficulty managing worry, worrying about different things, trouble relaxing, restlessness, and irritability.   (Status: maintained) Depression:   middle insomnia, lethargy. (Status: maintained)  Goals:   [name] experiences symptoms of anxiety.   Treatment plan signed and available on s-drive:  No, pending signature via MyChart.  [name] was sent the treatment plan signature form on 07/12/24.   Target Date: 07/12/25 Frequency: Weekly  Progress: 0 Modality: individual    Therapist will provide referrals for additional resources as appropriate.  Therapist will provide psycho-education regarding [name]'s diagnosis and corresponding treatment approaches and interventions. Meagan Mullet, LCSW will support the patient's ability to achieve the goals identified. will employ CBT, BA, Problem-solving, Solution Focused, Mindfulness,  coping skills, & other evidenced-based practices will be used to promote progress towards healthy functioning to help manage decrease symptoms associated with her diagnosis.   Reduce overall level, frequency, and intensity of the feelings of depression, anxiety and panic evidenced by decreased overall symptoms from 6 to 7 days/week to 0 to 1 days/week per client report for at least 3 consecutive months. Verbally express understanding of the relationship between feelings of depression, anxiety and their impact on thinking patterns and behaviors. Verbalize an understanding of the role that distorted thinking plays in creating fears, excessive worry, and ruminations.  [name] participated in the creation of the treatment  plan)    Meagan Mullet, LCSW

## 2024-11-08 ENCOUNTER — Ambulatory Visit: Admitting: Psychology

## 2024-12-06 ENCOUNTER — Ambulatory Visit: Admitting: Psychology
